# Patient Record
Sex: Female | Born: 1955 | Race: Black or African American | Hispanic: No | Marital: Married | Smoking: Former smoker
Health system: Southern US, Community
[De-identification: ages and names within clinical notes are randomized; demographics above are authoritative.]

## PROBLEM LIST (undated history)

## (undated) DIAGNOSIS — D649 Anemia, unspecified: Secondary | ICD-10-CM

## (undated) DIAGNOSIS — G894 Chronic pain syndrome: Secondary | ICD-10-CM

## (undated) DIAGNOSIS — N189 Chronic kidney disease, unspecified: Secondary | ICD-10-CM

## (undated) DIAGNOSIS — E079 Disorder of thyroid, unspecified: Secondary | ICD-10-CM

## (undated) DIAGNOSIS — F32A Depression, unspecified: Secondary | ICD-10-CM

## (undated) DIAGNOSIS — F1320 Sedative, hypnotic or anxiolytic dependence, uncomplicated: Secondary | ICD-10-CM

## (undated) DIAGNOSIS — J302 Other seasonal allergic rhinitis: Secondary | ICD-10-CM

## (undated) DIAGNOSIS — I700 Atherosclerosis of aorta: Secondary | ICD-10-CM

## (undated) DIAGNOSIS — R911 Solitary pulmonary nodule: Secondary | ICD-10-CM

## (undated) DIAGNOSIS — I10 Essential (primary) hypertension: Secondary | ICD-10-CM

## (undated) DIAGNOSIS — G4700 Insomnia, unspecified: Secondary | ICD-10-CM

## (undated) DIAGNOSIS — F419 Anxiety disorder, unspecified: Secondary | ICD-10-CM

## (undated) DIAGNOSIS — F329 Major depressive disorder, single episode, unspecified: Secondary | ICD-10-CM

## (undated) DIAGNOSIS — K219 Gastro-esophageal reflux disease without esophagitis: Secondary | ICD-10-CM

## (undated) DIAGNOSIS — J329 Chronic sinusitis, unspecified: Secondary | ICD-10-CM

## (undated) DIAGNOSIS — K589 Irritable bowel syndrome without diarrhea: Secondary | ICD-10-CM

## (undated) DIAGNOSIS — J45909 Unspecified asthma, uncomplicated: Secondary | ICD-10-CM

## (undated) HISTORY — DX: Anxiety disorder, unspecified: F41.9

## (undated) HISTORY — PX: RESECTION OF A THYMOMA: SHX6214

## (undated) HISTORY — PX: THORACIC SPINE SURGERY: SHX802

## (undated) HISTORY — DX: Essential (primary) hypertension: I10

## (undated) HISTORY — DX: Sedative, hypnotic or anxiolytic dependence, uncomplicated: F13.20

## (undated) HISTORY — PX: SINUS EXPLORATION: SHX5214

## (undated) HISTORY — DX: Irritable bowel syndrome, unspecified: K58.9

## (undated) HISTORY — DX: Major depressive disorder, single episode, unspecified: F32.9

## (undated) HISTORY — DX: Chronic kidney disease, unspecified: N18.9

## (undated) HISTORY — DX: Other seasonal allergic rhinitis: J30.2

## (undated) HISTORY — DX: Atherosclerosis of aorta: I70.0

## (undated) HISTORY — DX: Unspecified asthma, uncomplicated: J45.909

## (undated) HISTORY — PX: HEMATOMA EVACUATION: SHX5118

## (undated) HISTORY — PX: ABDOMINAL HYSTERECTOMY: SHX81

## (undated) HISTORY — DX: Gastro-esophageal reflux disease without esophagitis: K21.9

## (undated) HISTORY — DX: Disorder of thyroid, unspecified: E07.9

## (undated) HISTORY — DX: Depression, unspecified: F32.A

## (undated) HISTORY — DX: Solitary pulmonary nodule: R91.1

## (undated) HISTORY — PX: CARPAL TUNNEL RELEASE: SHX101

## (undated) HISTORY — PX: CERVICAL SPINE SURGERY: SHX589

## (undated) HISTORY — DX: Chronic pain syndrome: G89.4

## (undated) HISTORY — DX: Insomnia, unspecified: G47.00

## (undated) HISTORY — DX: Chronic sinusitis, unspecified: J32.9

## (undated) HISTORY — DX: Anemia, unspecified: D64.9

## (undated) HISTORY — PX: DILATION AND CURETTAGE OF UTERUS: SHX78

## (undated) HISTORY — PX: STOMACH SURGERY: SHX791

---

## 1997-10-28 ENCOUNTER — Other Ambulatory Visit: Admission: RE | Admit: 1997-10-28 | Discharge: 1997-10-28 | Payer: Self-pay | Admitting: *Deleted

## 1998-02-09 ENCOUNTER — Encounter: Payer: Self-pay | Admitting: Gastroenterology

## 1998-02-09 ENCOUNTER — Observation Stay (HOSPITAL_COMMUNITY): Admission: EM | Admit: 1998-02-09 | Discharge: 1998-02-10 | Payer: Self-pay

## 1999-05-15 ENCOUNTER — Other Ambulatory Visit: Admission: RE | Admit: 1999-05-15 | Discharge: 1999-05-15 | Payer: Self-pay | Admitting: *Deleted

## 2000-03-01 ENCOUNTER — Emergency Department (HOSPITAL_COMMUNITY): Admission: EM | Admit: 2000-03-01 | Discharge: 2000-03-01 | Payer: Self-pay | Admitting: Emergency Medicine

## 2000-06-16 ENCOUNTER — Other Ambulatory Visit: Admission: RE | Admit: 2000-06-16 | Discharge: 2000-06-16 | Payer: Self-pay | Admitting: *Deleted

## 2000-06-21 ENCOUNTER — Encounter: Payer: Self-pay | Admitting: Emergency Medicine

## 2000-06-21 ENCOUNTER — Emergency Department (HOSPITAL_COMMUNITY): Admission: EM | Admit: 2000-06-21 | Discharge: 2000-06-21 | Payer: Self-pay | Admitting: Emergency Medicine

## 2000-06-22 ENCOUNTER — Encounter: Payer: Self-pay | Admitting: Emergency Medicine

## 2001-05-25 ENCOUNTER — Ambulatory Visit (HOSPITAL_COMMUNITY): Admission: RE | Admit: 2001-05-25 | Discharge: 2001-05-25 | Payer: Self-pay | Admitting: Gastroenterology

## 2001-05-25 ENCOUNTER — Encounter: Payer: Self-pay | Admitting: Gastroenterology

## 2002-04-20 ENCOUNTER — Ambulatory Visit (HOSPITAL_COMMUNITY): Admission: RE | Admit: 2002-04-20 | Discharge: 2002-04-20 | Payer: Self-pay | Admitting: Gastroenterology

## 2002-04-20 ENCOUNTER — Encounter: Payer: Self-pay | Admitting: Gastroenterology

## 2003-04-08 ENCOUNTER — Emergency Department (HOSPITAL_COMMUNITY): Admission: EM | Admit: 2003-04-08 | Discharge: 2003-04-08 | Payer: Self-pay | Admitting: Emergency Medicine

## 2003-04-18 ENCOUNTER — Emergency Department (HOSPITAL_COMMUNITY): Admission: EM | Admit: 2003-04-18 | Discharge: 2003-04-19 | Payer: Self-pay | Admitting: Emergency Medicine

## 2003-05-18 ENCOUNTER — Encounter: Admission: RE | Admit: 2003-05-18 | Discharge: 2003-08-16 | Payer: Self-pay | Admitting: Neurology

## 2003-09-02 ENCOUNTER — Emergency Department (HOSPITAL_COMMUNITY): Admission: EM | Admit: 2003-09-02 | Discharge: 2003-09-03 | Payer: Self-pay | Admitting: Emergency Medicine

## 2003-10-11 ENCOUNTER — Encounter
Admission: RE | Admit: 2003-10-11 | Discharge: 2004-01-09 | Payer: Self-pay | Admitting: Physical Medicine and Rehabilitation

## 2004-05-16 ENCOUNTER — Ambulatory Visit: Payer: Self-pay | Admitting: Gastroenterology

## 2004-08-29 ENCOUNTER — Ambulatory Visit: Payer: Self-pay | Admitting: Internal Medicine

## 2005-05-28 ENCOUNTER — Encounter: Admission: RE | Admit: 2005-05-28 | Discharge: 2005-05-28 | Payer: Self-pay | Admitting: Neurology

## 2005-10-28 ENCOUNTER — Emergency Department (HOSPITAL_COMMUNITY): Admission: EM | Admit: 2005-10-28 | Discharge: 2005-10-28 | Payer: Self-pay | Admitting: Emergency Medicine

## 2005-11-27 ENCOUNTER — Encounter: Admission: RE | Admit: 2005-11-27 | Discharge: 2005-12-25 | Payer: Self-pay | Admitting: Family Medicine

## 2006-08-21 ENCOUNTER — Ambulatory Visit (HOSPITAL_COMMUNITY): Admission: RE | Admit: 2006-08-21 | Discharge: 2006-08-21 | Payer: Self-pay | Admitting: Family Medicine

## 2007-03-05 ENCOUNTER — Ambulatory Visit (HOSPITAL_COMMUNITY): Admission: RE | Admit: 2007-03-05 | Discharge: 2007-03-05 | Payer: Self-pay | Admitting: Neurosurgery

## 2008-08-31 ENCOUNTER — Encounter: Admission: RE | Admit: 2008-08-31 | Discharge: 2008-08-31 | Payer: Self-pay | Admitting: Neurosurgery

## 2008-12-28 ENCOUNTER — Emergency Department (HOSPITAL_COMMUNITY): Admission: EM | Admit: 2008-12-28 | Discharge: 2008-12-28 | Payer: Self-pay | Admitting: Emergency Medicine

## 2009-02-22 ENCOUNTER — Inpatient Hospital Stay (HOSPITAL_COMMUNITY): Admission: RE | Admit: 2009-02-22 | Discharge: 2009-02-27 | Payer: Self-pay | Admitting: Neurosurgery

## 2010-07-18 LAB — BLOOD GAS, ARTERIAL
Bicarbonate: 25.3 mEq/L — ABNORMAL HIGH (ref 20.0–24.0)
PEEP: 5 cmH2O
Patient temperature: 98.6
TCO2: 26.4 mmol/L (ref 0–100)

## 2010-07-18 LAB — BASIC METABOLIC PANEL
BUN: 6 mg/dL (ref 6–23)
CO2: 29 mEq/L (ref 19–32)
Chloride: 103 mEq/L (ref 96–112)
Creatinine, Ser: 0.92 mg/dL (ref 0.4–1.2)
GFR calc Af Amer: >60 mL/min (ref >60)
Sodium: 137 mEq/L (ref 135–145)

## 2010-07-18 LAB — CBC
HCT: 39.8 % (ref 36.0–46.0)
MCHC: 34.4 g/dL (ref 30.0–36.0)
Platelets: 248 10*3/uL (ref 150–400)
RBC: 4.03 MIL/uL (ref 3.87–5.11)
RDW: 14.1 % (ref 11.5–15.5)

## 2010-08-31 NOTE — Group Therapy Note (Signed)
REASON FOR ADMISSION:  Ms. Coccia is a 55 year old black married female who  is being seen for the first time in our [facility] for  complaints of neck, shoulder, and low back pain.   HISTORY OF PRESENT ILLNESS:  She reports her number one pain is posterior  neck pain.  Her number two on her list is her right lower thoracic pain.  The number three is her problems with her bowel which we will not be  addressing in the clinic today.  Number four is her shoulder pain.   Her neck pain averages about a 6 or a 7 on a scale of 10.  She has been seen  in previous pain management settings.  Has been treated in the past with  Demerol, Phenergan, Vicodin, Percocet, morphine, soft collar, Soma,  Ultracet.  Has had physical therapy.  She had a MRI March of 2005, a  cervical MRI, which showed shallow protrusion at C3/4 and C4/5.  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  It was noted that there may be some slight compromise exiting bilateral  neural foramina at the C4/5 level.   On April 08, 2003, she had plain films which showed degenerative disc  disease at the C5 level as well as some mild cervical foraminal compromise.   She underwent a procedure by Dr. Hilda Lias in 1991 at the T10 and T11  thoracic level and has had chronic pain into the right lower thoracic level  since that time.  She denies any new problems with numbness, tingling, and  no new bowel or bladder problems.  She has been taking Vicodin for pain  management.  She takes this on a p.r.n. basis.  For pain, she also takes the  [place] and has been on Darvocet in the past.   She is independent with all of her activities of daily living and mobility.  She walks about 10 minutes a day.  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  She is on the phone somewhat helping people find jobs.  She has a  degree in social work.  She spends some time working on the computer  doing  background investigations.  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  She denies any suicidal ideations.  She feels she is coping  well with her pain.  Denies alcohol, tobacco, or illicit drug use.  She is  married to a retired Nurse, adult x 20 years.  She last worked in 1999.  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  She received disability in 1999.   ALLERGIES/INTOLERANCES:  She reports that she gets gastritis and diarrhea  with  ANTI-INFLAMMATORY medications.  She has also had some intolerance or  allergy to PREDNISONE, DILAUDID, and LEVAQUIN, as well as some HORMONE.  She  is unable to elaborate on that.   REVIEW OF SYMPTOMS:  She did not fill out her review of systems.   PAST MEDICAL HISTORY:  1. Remarkable for essential tremor.  2. Chronic rhinitis.  3. History of gastritis.  4. Fibromyalgia.  5. Irritable bowel syndrome.  6. Chronic headache.  7. 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   PAST SURGICAL HISTORY:  1. Remarkable for T10/11 surgery by Dr. Hilda Lias in  1991.  2. Sinus surgery in 2000.  3. 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  4. Carpal tunnel surgery.  5. Two D&Cs.  6. Total hysterectomy.  7. Stomach surgery x 2 in 1989 and 1990.   PHYSICAL EXAMINATION:  GENERAL:  Reveals a thin, black female in no apparent  distress.  Appropriate and cooperative.  SKIN:  Remarkable for prominent sternal scar as well as thoracic scar in the  lower thoracic just to the right of the spine.  MUSCULOSKELETAL:  She has some limitations in cervical range of motion.  HEART:  Regular rhythm.  LUNGS:  Clear.  ABDOMEN:  Bowel sounds positive.  Benign.  EXTREMITIES:  Positive for bilateral upper extremity tremor.  No edema is  noted.  No instability is noted.  Gait is normal.  Romberg test is  negative.  She has full shoulder range of motion.  NEUROLOGICAL:  Reflexes are 2+ at biceps, triceps, brachial radialis  bilaterally.  They are 2+ at the knees and ankles bilaterally.  Toes are  downgoing.  No clonus is noted.  Motor strength in upper and lower  extremities is 5/5 at shoulder abductors, biceps, triceps, wrist extensor,  finger flexors, and intrinsics.  They are 5/5 at hip flexors, knee  extensors, dorsiflexors, and plantar flexors.   IMPRESSION:  1. Chronic neck pain.  2. Degenerative disc disease of the cervical spine.  3. Chronic headache.   DISCHARGE MEDICATIONS:  1. We will start the patient on Nortriptyline 10 mg 1 p.o. q.h.s.  2. We will give her Vicodin 5/500 mg 2 p.o. q.p.m. and 2 p.o. q.h.s. with     120 units given.  3. Also, we will give her Lidoderm 1 to 3 patches apply directly 12 hours on     and 12 hours off.   FOLLOW UP:  We will see her back in one month.     Brantley Stage, M.D.   DMK/MedQ  D:  10/12/2003 17:39:30  T:  10/13/2003 00:27:44  Job #:  [id number]   cc:   Otilio Connors. Gerri Spore, M.D.  [address]  [place]  [place] [address]  Fax: [phone]

## 2011-03-13 ENCOUNTER — Other Ambulatory Visit (HOSPITAL_COMMUNITY): Payer: Self-pay | Admitting: Anesthesiology

## 2011-03-13 ENCOUNTER — Ambulatory Visit (HOSPITAL_COMMUNITY)
Admission: RE | Admit: 2011-03-13 | Discharge: 2011-03-13 | Disposition: A | Discharge status: Home / Self Care | Payer: Medicare Other | Source: Ambulatory Visit | Attending: Anesthesiology | Admitting: Anesthesiology

## 2011-03-13 DIAGNOSIS — Z981 Arthrodesis status: Secondary | ICD-10-CM | POA: Insufficient documentation

## 2011-03-13 DIAGNOSIS — M542 Cervicalgia: Secondary | ICD-10-CM

## 2011-03-13 DIAGNOSIS — M5030 Other cervical disc degeneration, unspecified cervical region: Secondary | ICD-10-CM | POA: Insufficient documentation

## 2013-07-06 ENCOUNTER — Other Ambulatory Visit (HOSPITAL_COMMUNITY): Payer: Self-pay | Admitting: *Deleted

## 2013-07-06 ENCOUNTER — Ambulatory Visit (HOSPITAL_COMMUNITY)
Admission: RE | Admit: 2013-07-06 | Discharge: 2013-07-06 | Disposition: A | Discharge status: Home / Self Care | Payer: Medicare PPO | Source: Ambulatory Visit | Attending: Anesthesiology | Admitting: Anesthesiology

## 2013-07-06 DIAGNOSIS — M4120 Other idiopathic scoliosis, site unspecified: Secondary | ICD-10-CM | POA: Insufficient documentation

## 2013-07-06 DIAGNOSIS — R52 Pain, unspecified: Secondary | ICD-10-CM

## 2013-07-06 DIAGNOSIS — M545 Low back pain, unspecified: Secondary | ICD-10-CM | POA: Insufficient documentation

## 2013-07-06 DIAGNOSIS — G8929 Other chronic pain: Secondary | ICD-10-CM | POA: Insufficient documentation

## 2014-06-13 ENCOUNTER — Other Ambulatory Visit: Payer: Self-pay | Admitting: Family Medicine

## 2014-06-13 DIAGNOSIS — R7989 Other specified abnormal findings of blood chemistry: Secondary | ICD-10-CM

## 2014-06-24 ENCOUNTER — Other Ambulatory Visit: Payer: BC Managed Care – PPO

## 2014-07-01 ENCOUNTER — Other Ambulatory Visit: Payer: BC Managed Care – PPO

## 2014-07-05 ENCOUNTER — Other Ambulatory Visit: Payer: BC Managed Care – PPO

## 2014-07-08 ENCOUNTER — Other Ambulatory Visit: Payer: BC Managed Care – PPO

## 2014-07-14 ENCOUNTER — Other Ambulatory Visit: Payer: BC Managed Care – PPO

## 2014-07-19 ENCOUNTER — Other Ambulatory Visit: Payer: BC Managed Care – PPO

## 2014-07-21 ENCOUNTER — Other Ambulatory Visit: Payer: BC Managed Care – PPO

## 2014-07-25 ENCOUNTER — Other Ambulatory Visit: Payer: BC Managed Care – PPO

## 2014-11-25 DIAGNOSIS — M961 Postlaminectomy syndrome, not elsewhere classified: Secondary | ICD-10-CM | POA: Diagnosis not present

## 2014-11-25 DIAGNOSIS — Z79891 Long term (current) use of opiate analgesic: Secondary | ICD-10-CM | POA: Diagnosis not present

## 2014-11-25 DIAGNOSIS — M47812 Spondylosis without myelopathy or radiculopathy, cervical region: Secondary | ICD-10-CM | POA: Diagnosis not present

## 2014-11-25 DIAGNOSIS — G894 Chronic pain syndrome: Secondary | ICD-10-CM | POA: Diagnosis not present

## 2014-12-20 DIAGNOSIS — J329 Chronic sinusitis, unspecified: Secondary | ICD-10-CM | POA: Diagnosis not present

## 2015-01-19 DIAGNOSIS — Z79891 Long term (current) use of opiate analgesic: Secondary | ICD-10-CM | POA: Diagnosis not present

## 2015-01-19 DIAGNOSIS — M961 Postlaminectomy syndrome, not elsewhere classified: Secondary | ICD-10-CM | POA: Diagnosis not present

## 2015-01-19 DIAGNOSIS — M47812 Spondylosis without myelopathy or radiculopathy, cervical region: Secondary | ICD-10-CM | POA: Diagnosis not present

## 2015-01-19 DIAGNOSIS — G894 Chronic pain syndrome: Secondary | ICD-10-CM | POA: Diagnosis not present

## 2015-02-20 DIAGNOSIS — J310 Chronic rhinitis: Secondary | ICD-10-CM | POA: Diagnosis not present

## 2015-02-20 DIAGNOSIS — Z23 Encounter for immunization: Secondary | ICD-10-CM | POA: Diagnosis not present

## 2015-03-08 DIAGNOSIS — M47812 Spondylosis without myelopathy or radiculopathy, cervical region: Secondary | ICD-10-CM | POA: Diagnosis not present

## 2015-03-08 DIAGNOSIS — G894 Chronic pain syndrome: Secondary | ICD-10-CM | POA: Diagnosis not present

## 2015-03-08 DIAGNOSIS — Z79891 Long term (current) use of opiate analgesic: Secondary | ICD-10-CM | POA: Diagnosis not present

## 2015-03-08 DIAGNOSIS — M961 Postlaminectomy syndrome, not elsewhere classified: Secondary | ICD-10-CM | POA: Diagnosis not present

## 2015-03-17 ENCOUNTER — Ambulatory Visit: Payer: BC Managed Care – PPO | Admitting: Neurology

## 2015-03-24 ENCOUNTER — Ambulatory Visit: Payer: BC Managed Care – PPO | Admitting: Neurology

## 2015-04-24 ENCOUNTER — Ambulatory Visit: Payer: BC Managed Care – PPO | Admitting: Neurology

## 2015-04-27 ENCOUNTER — Ambulatory Visit (INDEPENDENT_AMBULATORY_CARE_PROVIDER_SITE_OTHER): Payer: Medicare Other | Admitting: Neurology

## 2015-04-27 ENCOUNTER — Encounter: Payer: Self-pay | Admitting: Neurology

## 2015-04-27 ENCOUNTER — Other Ambulatory Visit: Payer: Medicare Other

## 2015-04-27 VITALS — BP 102/62 | HR 77 | Ht 64.0 in | Wt 128.0 lb

## 2015-04-27 DIAGNOSIS — W19XXXA Unspecified fall, initial encounter: Secondary | ICD-10-CM | POA: Diagnosis not present

## 2015-04-27 DIAGNOSIS — R55 Syncope and collapse: Secondary | ICD-10-CM

## 2015-04-27 DIAGNOSIS — R443 Hallucinations, unspecified: Secondary | ICD-10-CM | POA: Diagnosis not present

## 2015-04-27 DIAGNOSIS — Z87898 Personal history of other specified conditions: Secondary | ICD-10-CM | POA: Diagnosis not present

## 2015-04-27 DIAGNOSIS — Z86018 Personal history of other benign neoplasm: Secondary | ICD-10-CM

## 2015-04-27 NOTE — Patient Instructions (Signed)
1. Your provider has requested that you have labwork completed today. Please go to [facility] ([id number]) on the second floor of this building before leaving the office today. You do not need to check in. If you are not called within 15 minutes please check with the front desk.  2. We have scheduled you at [facility] for your MRI on 05/12/2015 at 12:00 pm. Please arrive 15 minutes prior and go to 1st floor radiology. If you need to reschedule for any reason please call [phone].

## 2015-04-27 NOTE — Progress Notes (Signed)
Margaret Dominguez was seen today in neurologic consultation at the request of WEBB, CAROL D, MD.  The patient presents today for concerns with falls, near syncope and hallucinations.   The patient states that she has been falling for 8 months-1 year.  The patient has trouble staying focused and cannot describe her falls well so her husband was retrieved from the waiting room to see if better hx could be obtained.  Her husband states that he has never witnessed a fall and there is confusion whether she actually falls or whether she actually is fainting.  He does state that she has fallen out of bed several times.  However, when they say "falls" otherwise, he admits it is nothing he has witnessed, but rather he will come home and she is asleep on the floor in the closet (not sure if she fell asleep on the floor or passed out).  Pt states that she does pass out as she cannot remember the passage of time and recently hit her eye going down (does have a bruise below eye).   Her husband states that sometimes he will come home and she will think that someone is there and she will be getting towels and washcloths for "people" who are in the house (no one there).  Sometimes, her husband will tell her that he is leaving to go to work and she will call him an hour later to ask where he is as if he never told her.  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  When I asked her if she had hx of MG, she said yes but had trouble with stating if there was resolution of the myasthenia after the thymoma removal.  She goes off on a tangent and talks to me about a TB test when I tried to ask her about this.  When asked about diplopia, she says that she gets vertigo sometimes.   Neuroimaging has  previously been performed. She had a CT of the brain in September, 2010 that was normal.  She had an MRI of  the brain on 05/30/2005 that appears to be normal.  No formal report is available for that onebut I did review this.  The patient did not know her medications and the pharmacy had to be contacted.  She also had a significant amount of difficulty providing her past history, primarily due to focus issue.     ALLERGIES:   Allergies  Allergen Reactions  . Dilaudid [Hydromorphone]   . Levaquin [Levofloxacin In D5w]   . Prednisone     CURRENT MEDICATIONS:  Outpatient Encounter Prescriptions as of 04/27/2015  Medication Sig  . albuterol (PROVENTIL) (2.5 MG/3ML) 0.083% nebulizer solution Take 2.5 mg by nebulization every 6 (six) hours as needed for wheezing or shortness of breath.  . Biotin 1 MG CAPS Take by mouth daily.  Marland Kitchen buPROPion (WELLBUTRIN SR) 100 MG 12 hr tablet Take 100 mg by mouth 2 (two) times daily.  . calcium carbonate (CALCIUM 600) 600 MG TABS tablet Take 600 mg by mouth 2 (two) times daily with a meal.  . calcium carbonate (TUMS - DOSED IN MG ELEMENTAL CALCIUM) 500 MG chewable tablet Chew 1 tablet by mouth daily.  . clonazePAM (KLONOPIN) 2 MG tablet Take 2 mg by mouth 4 (four) times daily.   . cromolyn (OPTICROM)  4 % ophthalmic solution 4 (four) times daily.  . diclofenac sodium (VOLTAREN) 1 % GEL Apply topically 4 (four) times daily.  . fexofenadine (ALLEGRA) 180 MG tablet Take 180 mg by mouth daily.  . fluticasone (FLONASE) 50 MCG/ACT nasal spray Place into both nostrils daily.  . mometasone (ASMANEX) 220 MCG/INH inhaler Inhale 2 puffs into the lungs daily.  . montelukast (SINGULAIR) 10 MG tablet Take 10 mg by mouth at bedtime.  . tapentadol (NUCYNTA) 50 MG TABS tablet Take 50 mg by mouth 2 (two) times daily.  Marland Kitchen. tiZANidine (ZANAFLEX) 4 MG capsule Take 4 mg by mouth 3 (three) times daily as needed for muscle spasms.  Marland Kitchen. venlafaxine XR (EFFEXOR-XR) 75 MG 24 hr capsule Take 75 mg by mouth daily with breakfast.  . verapamil (VERELAN PM) 240 MG 24 hr capsule Take 240 mg by mouth at  bedtime.  . vitamin E 400 UNIT capsule Take 400 Units by mouth daily.  . [DISCONTINUED] Tapentadol HCl (NUCYNTA) 75 MG TABS Take by mouth.  . [DISCONTINUED] venlafaxine (EFFEXOR) 100 MG tablet Take 100 mg by mouth 2 (two) times daily.   No facility-administered encounter medications on file as of 04/27/2015.    PAST MEDICAL HISTORY:   Past Medical History  Diagnosis Date  . Depression   . Chronic pain syndrome   . Asthma   . Seasonal allergies     PAST SURGICAL HISTORY:   Past Surgical History  Procedure Laterality Date  . Resection of a thymoma    . Carpal tunnel release Bilateral   . Sinus exploration    . Dilation and curettage of uterus    . Abdominal hysterectomy    . Thoracic spine surgery    . Stomach surgery  x2    cut vagus nerve - repair  . Cervical spine surgery    . Hematoma evacuation      SOCIAL HISTORY:   Social History   Social History  . Marital Status: Married    Spouse Name: N/A  . Number of Children: N/A  . Years of Education: N/A   Occupational History  . Not on file.   Social History Main Topics  . Smoking status: Former Games developermoker  . Smokeless tobacco: Not on file  . Alcohol Use: No  . Drug Use: No  . Sexual Activity: Not on file   Other Topics Concern  . Not on file   Social History Narrative  . No narrative on file    FAMILY HISTORY:   No family status information on file.    ROS:  A complete 10 system review of systems was obtained and was unremarkable apart from what is mentioned above.  PHYSICAL EXAMINATION:    VITALS:   Filed Vitals:   04/27/15 1458  BP: 102/62  Pulse: 77  Height: 5\' 4"  (1.626 m)  Weight: 128 lb (58.06 kg)  SpO2: 99%    GEN:  Normal appears female in no acute distress.  Appears stated age. HEENT:  Normocephalic.  Healing ecchymosis under L eye.  The mucous membranes are moist. The superficial temporal arteries are without ropiness or tenderness. Cardiovascular: Regular rate and rhythm. Lungs: Clear  to auscultation bilaterally. Neck/Heme: There are no carotid bruits noted bilaterally.  NEUROLOGICAL: Orientation:  The patient is alert and oriented x 3.  However, she is very tangential in speech and has trouble staying focused, giving hx, etc. Cranial nerves: There is good facial symmetry. The pupils are equal round and reactive to light bilaterally. Fundoscopic  exam reveals clear disc margins bilaterally. Extraocular muscles are intact and visual fields are full to confrontational testing. Speech is fluent and clear. Soft palate rises symmetrically and there is no tongue deviation. Hearing is intact to conversational tone. Tone: Tone is good throughout. Sensation: Sensation is intact to light touch and pinprick throughout (facial, trunk, extremities). Vibration is intact at the bilateral big toe. There is no extinction with double simultaneous stimulation. There is no sensory dermatomal level identified. Coordination:  The patient has no difficulty with RAM's or FNF bilaterally. Motor: Strength is 5/5 in the bilateral upper and lower extremities.  Shoulder shrug is equal and symmetric. There is no pronator drift.  There are no fasciculations noted. DTR's: Deep tendon reflexes are 1/4 at the bilateral biceps, triceps, brachioradialis, patella and achilles.  Plantar responses are downgoing bilaterally. Gait and Station: The patient is able to ambulate without difficulty.  She does have some difficulty ambulating in a tandem fashion.  She was able to stand in the Romberg position with eyes open and closed.  Her history is incredibly poor   IMPRESSION/PLAN  1. Episodes of falls, near syncope and hallucinations.  -Unfortunately, her history is incredibly poor.  Her husband has never witnessed a fall and the patient has  These ar trouble describing any type of fall to me.  Her husband does state that he will come home from work and find her on the floor but he is not sure if she has fallen asleep on  the floor or passed out.   I suspect that all of these events are most likely due to medications.  She is on a significant amount clonazepam daily (2mg  qid - may have recently been decreased to tid) along with Nucynta.  These are prescribed by different providers.  She is also on zanaflex.   I talked to her today about the fact that both of these medications can cause cognitive dulling and mental status change.  I will do an MRI of the brain and EEG, but I think that the yield of finding anything structural is low.  Her neurologic examination is nonfocal and nonlateralizing.  I will also check MG panel given hx of thymoma years ago.  I don't think that she has any sx's of such now though but she is such a poor historian I will check.  I would not want her driving given her sx's, even if due to medications.

## 2015-05-04 ENCOUNTER — Other Ambulatory Visit: Payer: Medicare Other

## 2015-05-04 LAB — MYASTHENIA GRAVIS PANEL 2
ACETYLCHOLINE REC MOD AB: 13 %{inhibition}
Acetylcholine Rec Binding: <0.3 nmol/L
Aceytlcholine Rec Bloc Ab: <15 % of inhibition (ref <15)

## 2015-05-05 ENCOUNTER — Telehealth: Payer: Self-pay | Admitting: Neurology

## 2015-05-05 NOTE — Telephone Encounter (Signed)
-----   Message from Margaret Batty Tat, DO sent at 05/05/2015  7:49 AM EST ----- You can let pt know that labs we drew were ok

## 2015-05-05 NOTE — Telephone Encounter (Signed)
Patient made aware blood work okay.

## 2015-05-10 ENCOUNTER — Other Ambulatory Visit: Payer: Medicare Other

## 2015-05-12 ENCOUNTER — Ambulatory Visit (HOSPITAL_COMMUNITY): Admission: RE | Admit: 2015-05-12 | Payer: Medicare Other | Source: Ambulatory Visit

## 2015-05-26 ENCOUNTER — Ambulatory Visit (HOSPITAL_COMMUNITY): Admission: RE | Admit: 2015-05-26 | Payer: Medicare Other | Source: Ambulatory Visit

## 2015-06-09 ENCOUNTER — Ambulatory Visit (HOSPITAL_COMMUNITY)
Admission: RE | Admit: 2015-06-09 | Discharge: 2015-06-09 | Disposition: A | Discharge status: Home / Self Care | Payer: Medicare Other | Source: Ambulatory Visit | Attending: Neurology | Admitting: Neurology

## 2015-06-09 DIAGNOSIS — R443 Hallucinations, unspecified: Secondary | ICD-10-CM | POA: Insufficient documentation

## 2015-06-09 DIAGNOSIS — W19XXXA Unspecified fall, initial encounter: Secondary | ICD-10-CM | POA: Diagnosis not present

## 2015-06-09 DIAGNOSIS — R55 Syncope and collapse: Secondary | ICD-10-CM

## 2015-06-12 ENCOUNTER — Telehealth: Payer: Self-pay | Admitting: Neurology

## 2015-06-12 NOTE — Telephone Encounter (Signed)
Patient made aware MR okay.  

## 2015-06-12 NOTE — Telephone Encounter (Signed)
-----   Message from Octaviano Batty Tat, DO sent at 06/11/2015  4:58 PM EST ----- Reviewed and agree.  Bren Borys please let pt know that MRI looks great

## 2017-02-05 ENCOUNTER — Other Ambulatory Visit: Payer: Self-pay | Admitting: Family Medicine

## 2017-02-05 DIAGNOSIS — R1011 Right upper quadrant pain: Secondary | ICD-10-CM

## 2017-02-11 ENCOUNTER — Other Ambulatory Visit: Payer: Medicare Other

## 2017-02-14 ENCOUNTER — Other Ambulatory Visit: Payer: Medicare Other

## 2017-02-17 ENCOUNTER — Ambulatory Visit
Admission: RE | Admit: 2017-02-17 | Discharge: 2017-02-17 | Disposition: A | Discharge status: Home / Self Care | Payer: Medicare Other | Source: Ambulatory Visit | Attending: Family Medicine | Admitting: Family Medicine

## 2017-02-17 DIAGNOSIS — R1011 Right upper quadrant pain: Secondary | ICD-10-CM

## 2017-07-11 ENCOUNTER — Other Ambulatory Visit: Payer: Self-pay | Admitting: Orthopaedic Surgery

## 2017-07-11 DIAGNOSIS — M542 Cervicalgia: Secondary | ICD-10-CM

## 2017-07-11 DIAGNOSIS — M546 Pain in thoracic spine: Secondary | ICD-10-CM

## 2017-07-19 ENCOUNTER — Inpatient Hospital Stay: Admission: RE | Admit: 2017-07-19 | Payer: Medicare Other | Source: Ambulatory Visit

## 2017-07-19 ENCOUNTER — Other Ambulatory Visit: Payer: Medicare Other

## 2017-07-26 ENCOUNTER — Other Ambulatory Visit: Payer: Medicare Other

## 2017-07-26 ENCOUNTER — Inpatient Hospital Stay
Admission: RE | Admit: 2017-07-26 | Discharge: 2017-07-26 | Disposition: A | Discharge status: Home / Self Care | Payer: Medicare Other | Source: Ambulatory Visit | Attending: Orthopaedic Surgery | Admitting: Orthopaedic Surgery

## 2017-07-27 ENCOUNTER — Other Ambulatory Visit: Payer: Medicare Other

## 2017-08-01 ENCOUNTER — Other Ambulatory Visit: Payer: Medicare Other

## 2017-08-08 ENCOUNTER — Ambulatory Visit
Admission: RE | Admit: 2017-08-08 | Discharge: 2017-08-08 | Disposition: A | Discharge status: Home / Self Care | Payer: Medicare Other | Source: Ambulatory Visit | Attending: Orthopaedic Surgery | Admitting: Orthopaedic Surgery

## 2017-08-08 DIAGNOSIS — M546 Pain in thoracic spine: Secondary | ICD-10-CM

## 2017-08-08 DIAGNOSIS — M542 Cervicalgia: Secondary | ICD-10-CM

## 2019-07-21 ENCOUNTER — Other Ambulatory Visit: Payer: Self-pay | Admitting: Family Medicine

## 2019-07-23 ENCOUNTER — Other Ambulatory Visit: Payer: Self-pay | Admitting: Family Medicine

## 2019-07-23 DIAGNOSIS — R221 Localized swelling, mass and lump, neck: Secondary | ICD-10-CM

## 2019-07-29 ENCOUNTER — Other Ambulatory Visit: Payer: Medicare Other

## 2019-08-04 ENCOUNTER — Other Ambulatory Visit: Payer: Medicare PPO

## 2019-08-13 ENCOUNTER — Ambulatory Visit
Admission: RE | Admit: 2019-08-13 | Discharge: 2019-08-13 | Disposition: A | Discharge status: Home / Self Care | Payer: Medicare PPO | Source: Ambulatory Visit | Attending: Family Medicine | Admitting: Family Medicine

## 2019-08-13 DIAGNOSIS — R221 Localized swelling, mass and lump, neck: Secondary | ICD-10-CM

## 2019-10-12 DIAGNOSIS — M961 Postlaminectomy syndrome, not elsewhere classified: Secondary | ICD-10-CM | POA: Diagnosis not present

## 2019-10-12 DIAGNOSIS — M47812 Spondylosis without myelopathy or radiculopathy, cervical region: Secondary | ICD-10-CM | POA: Diagnosis not present

## 2019-10-12 DIAGNOSIS — Z79891 Long term (current) use of opiate analgesic: Secondary | ICD-10-CM | POA: Diagnosis not present

## 2019-10-12 DIAGNOSIS — G894 Chronic pain syndrome: Secondary | ICD-10-CM | POA: Diagnosis not present

## 2019-11-05 DIAGNOSIS — J0100 Acute maxillary sinusitis, unspecified: Secondary | ICD-10-CM | POA: Diagnosis not present

## 2019-12-16 DIAGNOSIS — G894 Chronic pain syndrome: Secondary | ICD-10-CM | POA: Diagnosis not present

## 2019-12-16 DIAGNOSIS — M961 Postlaminectomy syndrome, not elsewhere classified: Secondary | ICD-10-CM | POA: Diagnosis not present

## 2019-12-16 DIAGNOSIS — M47812 Spondylosis without myelopathy or radiculopathy, cervical region: Secondary | ICD-10-CM | POA: Diagnosis not present

## 2019-12-16 DIAGNOSIS — Z79891 Long term (current) use of opiate analgesic: Secondary | ICD-10-CM | POA: Diagnosis not present

## 2019-12-24 DIAGNOSIS — J0100 Acute maxillary sinusitis, unspecified: Secondary | ICD-10-CM | POA: Diagnosis not present

## 2020-01-18 DIAGNOSIS — W19XXXS Unspecified fall, sequela: Secondary | ICD-10-CM | POA: Diagnosis not present

## 2020-01-18 DIAGNOSIS — S0083XA Contusion of other part of head, initial encounter: Secondary | ICD-10-CM | POA: Diagnosis not present

## 2020-03-01 DIAGNOSIS — Z79891 Long term (current) use of opiate analgesic: Secondary | ICD-10-CM | POA: Diagnosis not present

## 2020-03-01 DIAGNOSIS — M47812 Spondylosis without myelopathy or radiculopathy, cervical region: Secondary | ICD-10-CM | POA: Diagnosis not present

## 2020-03-01 DIAGNOSIS — G894 Chronic pain syndrome: Secondary | ICD-10-CM | POA: Diagnosis not present

## 2020-03-01 DIAGNOSIS — M961 Postlaminectomy syndrome, not elsewhere classified: Secondary | ICD-10-CM | POA: Diagnosis not present

## 2020-04-04 DIAGNOSIS — J329 Chronic sinusitis, unspecified: Secondary | ICD-10-CM | POA: Diagnosis not present

## 2020-05-12 DIAGNOSIS — M269 Dentofacial anomaly, unspecified: Secondary | ICD-10-CM | POA: Diagnosis not present

## 2020-05-12 DIAGNOSIS — M2632 Excessive spacing of fully erupted teeth: Secondary | ICD-10-CM | POA: Diagnosis not present

## 2020-05-25 DIAGNOSIS — G894 Chronic pain syndrome: Secondary | ICD-10-CM | POA: Diagnosis not present

## 2020-05-25 DIAGNOSIS — Z79891 Long term (current) use of opiate analgesic: Secondary | ICD-10-CM | POA: Diagnosis not present

## 2020-05-25 DIAGNOSIS — M961 Postlaminectomy syndrome, not elsewhere classified: Secondary | ICD-10-CM | POA: Diagnosis not present

## 2020-05-25 DIAGNOSIS — M47812 Spondylosis without myelopathy or radiculopathy, cervical region: Secondary | ICD-10-CM | POA: Diagnosis not present

## 2020-06-13 DIAGNOSIS — K089 Disorder of teeth and supporting structures, unspecified: Secondary | ICD-10-CM | POA: Diagnosis not present

## 2020-06-13 DIAGNOSIS — J329 Chronic sinusitis, unspecified: Secondary | ICD-10-CM | POA: Diagnosis not present

## 2020-08-21 IMAGING — US US THYROID
1 series · 14 of 25 positions shown · non-contrast
Comparison: None.

CLINICAL DATA: 63-year-old female with a neck mass

EXAM:
THYROID ULTRASOUND
TECHNIQUE: Ultrasound examination of the thyroid gland and adjacent soft
tissues was performed.

[Series 1: us thyroid · 0.05mm/px · 14 of 41 slices shown]
[im 1/41]
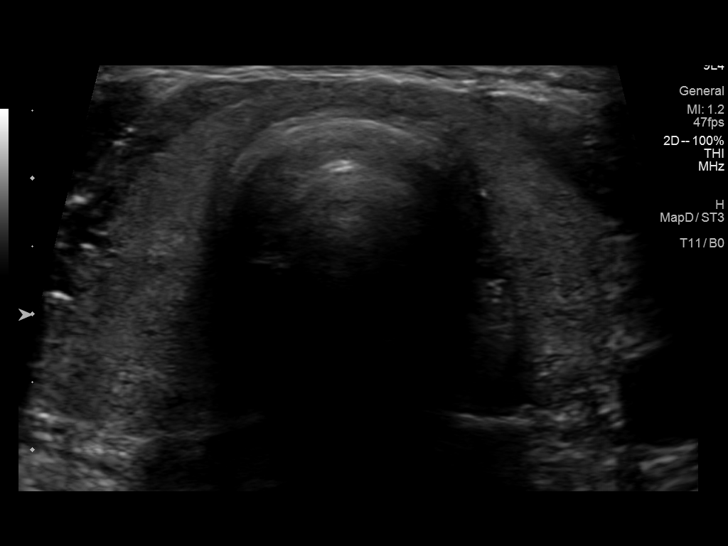
[im 4/41]
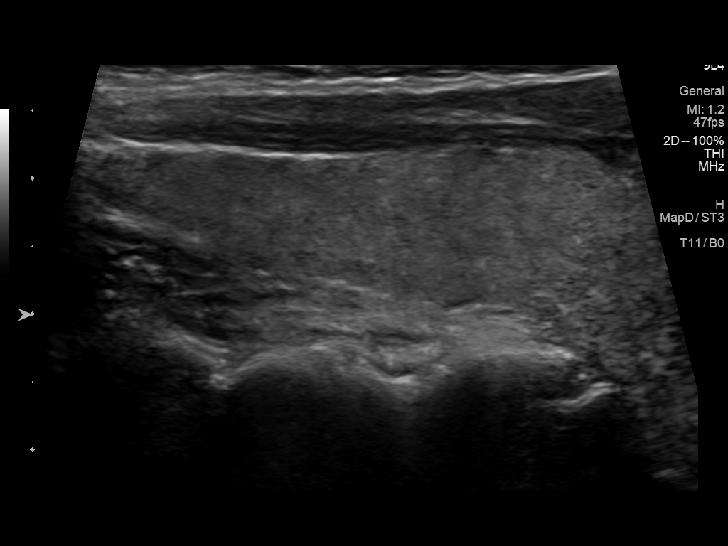
[im 7/41]
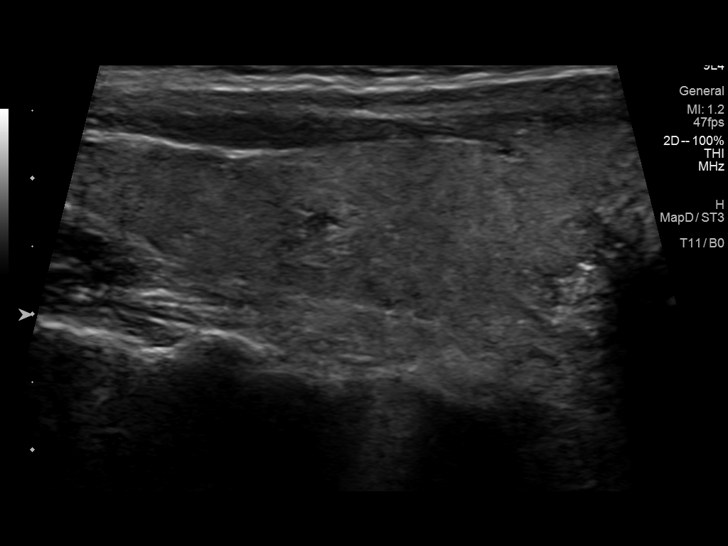
[im 11/41]
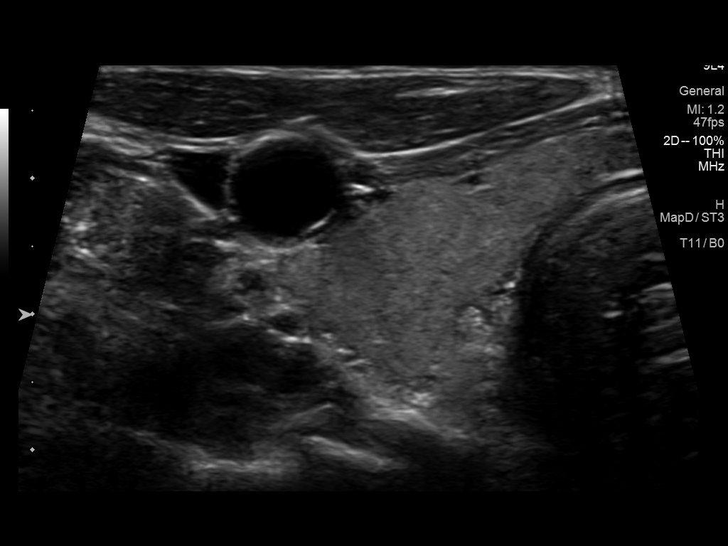
[im 14/41]
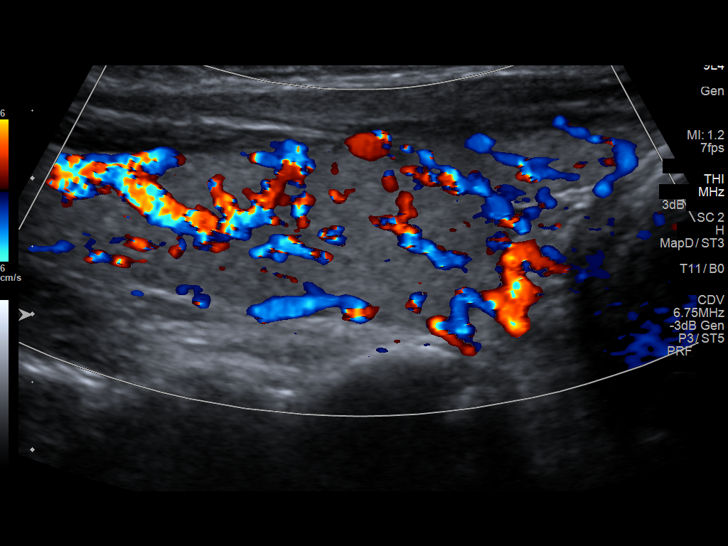
[im 16/41]
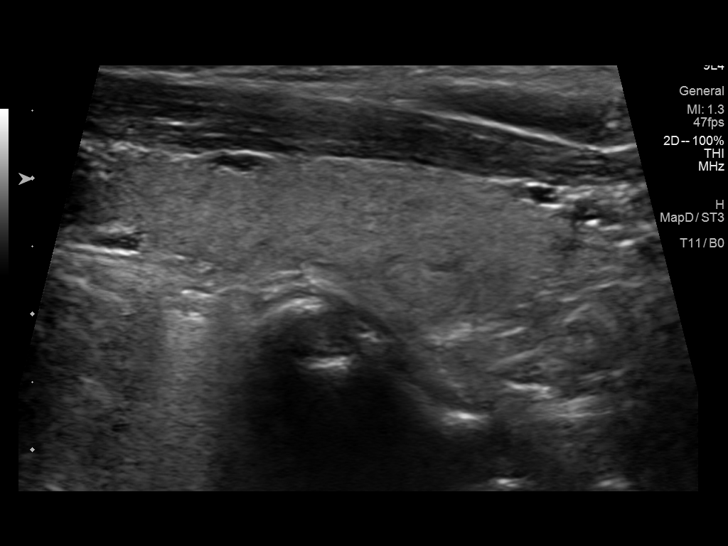
[im 19/41]
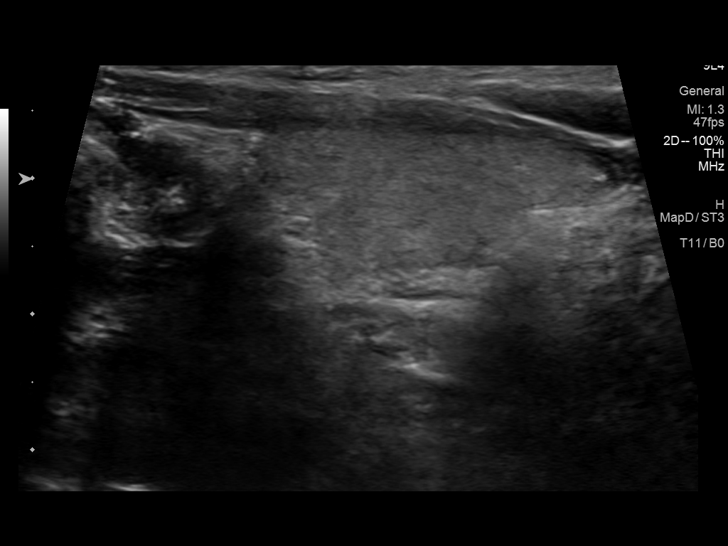
[im 22/41]
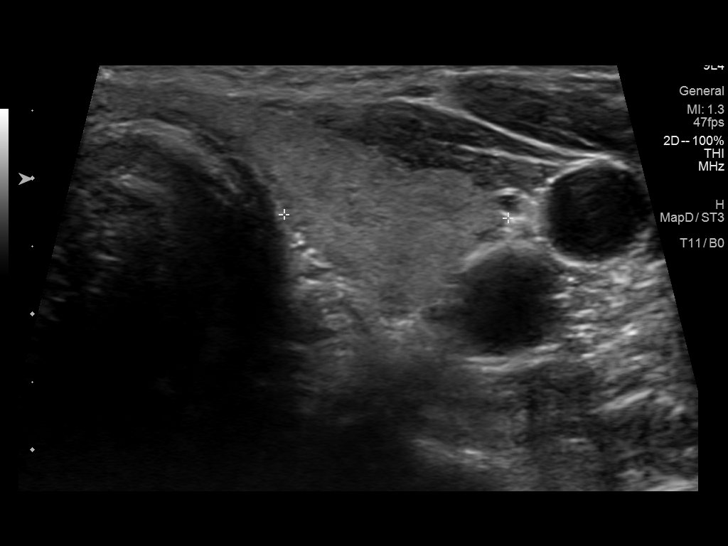
[im 26/41]
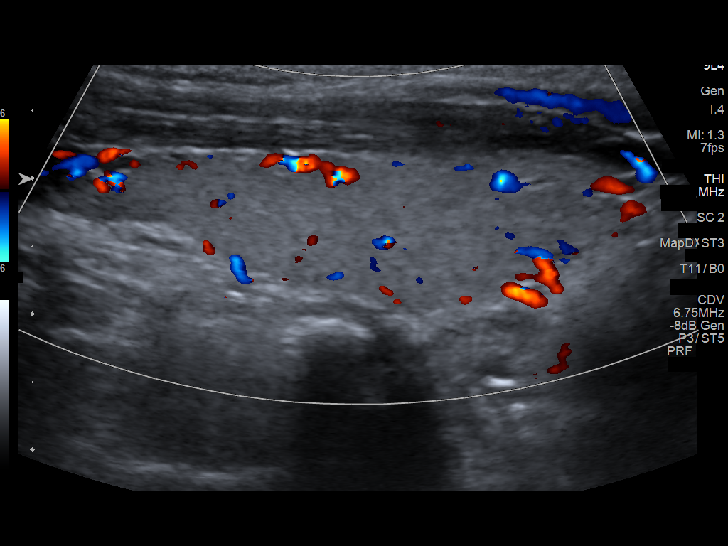
[im 27/41]
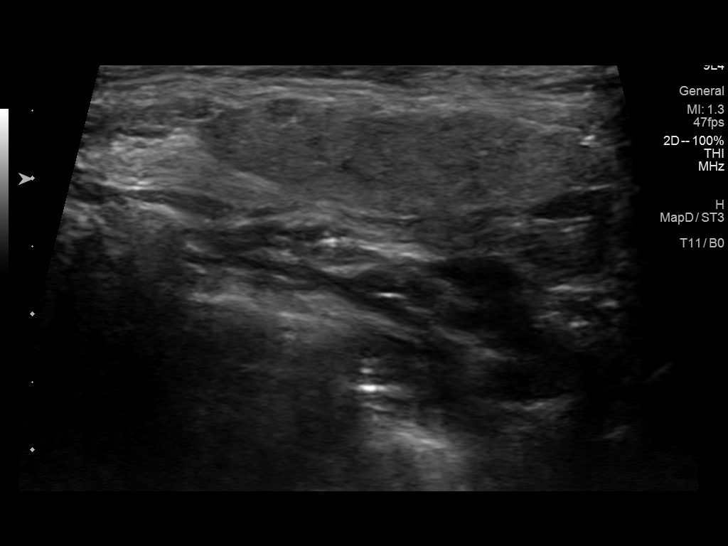
[im 31/41]
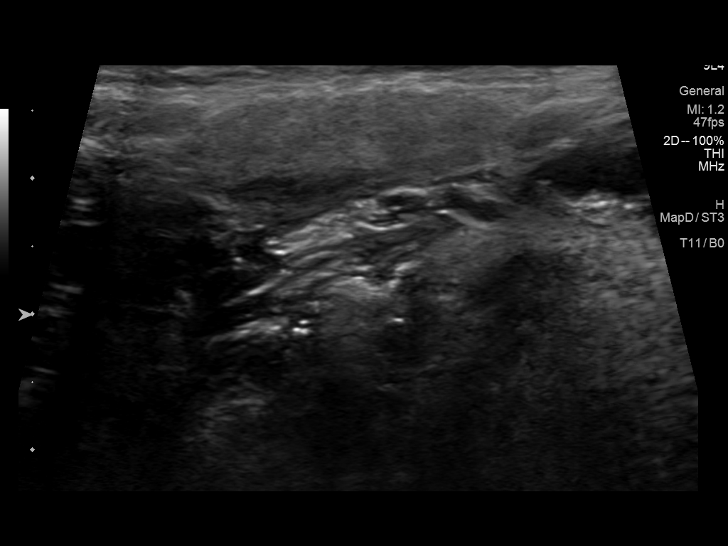
[im 34/41]
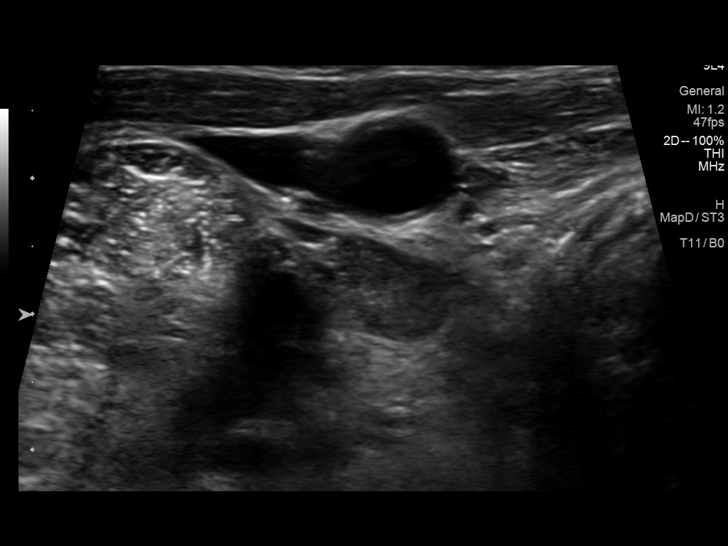
[im 37/41]
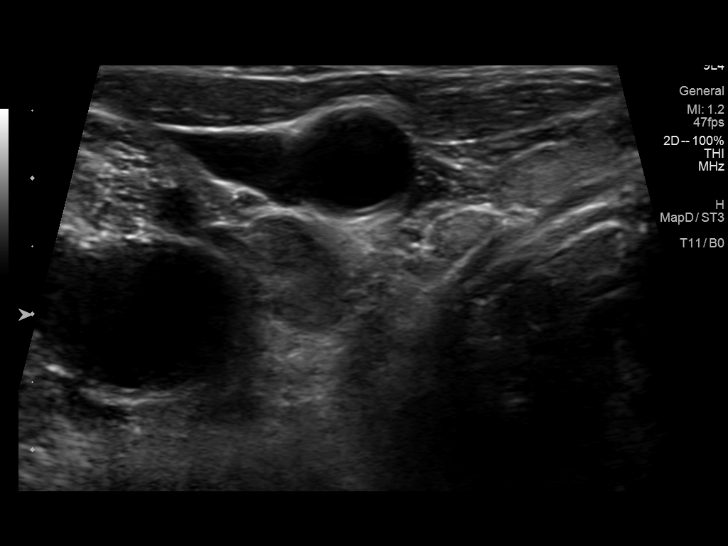
[im 41/41]
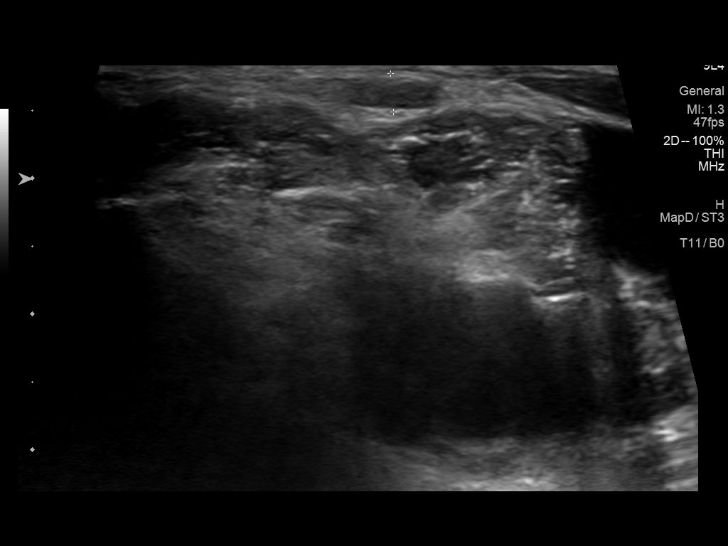

[14 of 25 positions shown; findings below may reference images not displayed]

FINDINGS: Parenchymal Echotexture: Normal

Isthmus: 0.2 cm

Right lobe: 4.6 cm x 1.3 cm x 1.8 cm

Left lobe: 4.1 cm x 1.2 cm x 1.7 cm

_________________________________________________________

Estimated total number of nodules >/= 1 cm: 0

Number of spongiform nodules >/=  2 cm not described below (TR1): 0

Number of mixed cystic and solid nodules >/= 1.5 cm not described
below (TR2): 0

_________________________________________________________

No discrete nodules are seen within the thyroid gland.

No adenopathy. Lymph nodes present demonstrate typical architecture.
IMPRESSION: Unremarkable sonographic survey of the thyroid.

Imaged lymph nodes are typical appearing, potentially reactive
though nonspecific.

## 2021-05-15 DIAGNOSIS — M47812 Spondylosis without myelopathy or radiculopathy, cervical region: Secondary | ICD-10-CM | POA: Diagnosis not present

## 2021-05-15 DIAGNOSIS — G894 Chronic pain syndrome: Secondary | ICD-10-CM | POA: Diagnosis not present

## 2021-05-15 DIAGNOSIS — Z79891 Long term (current) use of opiate analgesic: Secondary | ICD-10-CM | POA: Diagnosis not present

## 2021-05-15 DIAGNOSIS — M961 Postlaminectomy syndrome, not elsewhere classified: Secondary | ICD-10-CM | POA: Diagnosis not present

## 2021-06-14 DIAGNOSIS — Z79899 Other long term (current) drug therapy: Secondary | ICD-10-CM | POA: Diagnosis not present

## 2021-06-14 DIAGNOSIS — R000 Tachycardia, unspecified: Secondary | ICD-10-CM | POA: Diagnosis not present

## 2021-06-14 DIAGNOSIS — F419 Anxiety disorder, unspecified: Secondary | ICD-10-CM | POA: Diagnosis not present

## 2021-06-14 DIAGNOSIS — K08109 Complete loss of teeth, unspecified cause, unspecified class: Secondary | ICD-10-CM | POA: Diagnosis not present

## 2021-07-12 DIAGNOSIS — G894 Chronic pain syndrome: Secondary | ICD-10-CM | POA: Diagnosis not present

## 2021-07-12 DIAGNOSIS — Z79891 Long term (current) use of opiate analgesic: Secondary | ICD-10-CM | POA: Diagnosis not present

## 2021-07-12 DIAGNOSIS — M961 Postlaminectomy syndrome, not elsewhere classified: Secondary | ICD-10-CM | POA: Diagnosis not present

## 2021-07-12 DIAGNOSIS — M47812 Spondylosis without myelopathy or radiculopathy, cervical region: Secondary | ICD-10-CM | POA: Diagnosis not present

## 2021-07-16 DIAGNOSIS — I700 Atherosclerosis of aorta: Secondary | ICD-10-CM | POA: Diagnosis not present

## 2021-07-16 DIAGNOSIS — M797 Fibromyalgia: Secondary | ICD-10-CM | POA: Diagnosis not present

## 2021-07-16 DIAGNOSIS — G894 Chronic pain syndrome: Secondary | ICD-10-CM | POA: Diagnosis not present

## 2021-07-16 DIAGNOSIS — F17211 Nicotine dependence, cigarettes, in remission: Secondary | ICD-10-CM | POA: Diagnosis not present

## 2021-07-16 DIAGNOSIS — R911 Solitary pulmonary nodule: Secondary | ICD-10-CM | POA: Diagnosis not present

## 2021-07-16 DIAGNOSIS — I10 Essential (primary) hypertension: Secondary | ICD-10-CM | POA: Diagnosis not present

## 2021-07-16 DIAGNOSIS — R000 Tachycardia, unspecified: Secondary | ICD-10-CM | POA: Diagnosis not present

## 2021-07-16 DIAGNOSIS — Z79899 Other long term (current) drug therapy: Secondary | ICD-10-CM | POA: Diagnosis not present

## 2021-07-16 DIAGNOSIS — J310 Chronic rhinitis: Secondary | ICD-10-CM | POA: Diagnosis not present

## 2021-08-29 ENCOUNTER — Ambulatory Visit: Payer: Medicare PPO | Admitting: Nurse Practitioner

## 2021-08-30 ENCOUNTER — Ambulatory Visit: Payer: Medicare PPO | Admitting: Family Medicine

## 2021-08-30 ENCOUNTER — Encounter: Payer: Self-pay | Admitting: Family Medicine

## 2021-08-30 VITALS — BP 146/88 | HR 88 | Temp 98.0°F | Ht 64.0 in | Wt 119.0 lb

## 2021-08-30 DIAGNOSIS — F419 Anxiety disorder, unspecified: Secondary | ICD-10-CM

## 2021-08-30 DIAGNOSIS — G894 Chronic pain syndrome: Secondary | ICD-10-CM

## 2021-08-30 DIAGNOSIS — Z9109 Other allergy status, other than to drugs and biological substances: Secondary | ICD-10-CM

## 2021-08-30 MED ORDER — CROMOLYN SODIUM 4 % OP SOLN: 1.0000 [drp] | Freq: Four times a day (QID) | OPHTHALMIC | 0 refills | Status: AC

## 2021-08-30 MED ORDER — MONTELUKAST SODIUM 10 MG PO TABS: 10.0000 mg | ORAL_TABLET | Freq: Every day | ORAL | 5 refills | Status: AC

## 2021-08-30 NOTE — Patient Instructions (Signed)
You can call to schedule your appointment with a psychiatrist.  A few offices are listed below for you to call.   [facility]  [address], [place], [place] [address]  Phone: [phone]    [facility] [address] [place], [place] [address]  Phone: [phone]     [facility] [address] Sonny Masters [place], [place] [address] [phone]

## 2021-08-30 NOTE — Progress Notes (Signed)
New Patient Office Visit  Subjective    Patient ID: Margaret Dominguez, female    DOB: 1955-09-22  Age: 66 y.o. MRN: [id number]  CC:  Chief Complaint  Patient presents with   Establish Care    HPI Margaret Dominguez presents to establish care.   No medial records available today. She has been an [facility] patient.   Dr. Vear Clock prescrbes Nycynta and Zanaflex.   Dr Radium Callas is her allergist.   States she has seen a neurologist in the past.   States she was seeing  a court ordered psychiatrist but is no longer seeing them.  She is requesting that I start prescribing clonazepam. She is not seeing a therapist.   It is difficult to keep patient on topic.     Outpatient Encounter Medications as of 08/30/2021  Medication Sig   [DISCONTINUED] cromolyn (OPTICROM) 4 % ophthalmic solution 4 (four) times daily.   albuterol (PROVENTIL) (2.5 MG/3ML) 0.083% nebulizer solution Take 2.5 mg by nebulization every 6 (six) hours as needed for wheezing or shortness of breath.   Biotin 1 MG CAPS Take by mouth daily.   calcium carbonate (CALCIUM 600) 600 MG TABS tablet Take 600 mg by mouth 2 (two) times daily with a meal.   calcium carbonate (TUMS - DOSED IN MG ELEMENTAL CALCIUM) 500 MG chewable tablet Chew 1 tablet by mouth daily.   clonazePAM (KLONOPIN) 2 MG tablet Take 2 mg by mouth 4 (four) times daily.    cromolyn (OPTICROM) 4 % ophthalmic solution Place 1 drop into both eyes 4 (four) times daily.   diclofenac sodium (VOLTAREN) 1 % GEL Apply topically 4 (four) times daily.   fexofenadine (ALLEGRA) 180 MG tablet Take 180 mg by mouth daily.   fluticasone (FLONASE) 50 MCG/ACT nasal spray Place into both nostrils daily.   mometasone (ASMANEX) 220 MCG/INH inhaler Inhale 2 puffs into the lungs daily.   montelukast (SINGULAIR) 10 MG tablet Take 1 tablet (10 mg total) by mouth at bedtime.   tapentadol (NUCYNTA) 50 MG TABS tablet Take 50 mg by mouth 2 (two) times daily.   tiZANidine (ZANAFLEX) 4 MG capsule  Take 4 mg by mouth 3 (three) times daily as needed for muscle spasms.   verapamil (VERELAN PM) 240 MG 24 hr capsule Take 240 mg by mouth at bedtime.   vitamin E 400 UNIT capsule Take 400 Units by mouth daily.   [DISCONTINUED] buPROPion (WELLBUTRIN SR) 100 MG 12 hr tablet Take 100 mg by mouth 2 (two) times daily.   [DISCONTINUED] montelukast (SINGULAIR) 10 MG tablet Take 10 mg by mouth at bedtime.   [DISCONTINUED] venlafaxine XR (EFFEXOR-XR) 75 MG 24 hr capsule Take 75 mg by mouth daily with breakfast.   No facility-administered encounter medications on file as of 08/30/2021.    Past Medical History:  Diagnosis Date   Asthma    Chronic pain syndrome    Depression    Seasonal allergies      No family history on file.  Social History   Socioeconomic History   Marital status: Married    Spouse name: Not on file   Number of children: Not on file   Years of education: Not on file   Highest education level: Not on file  Occupational History   Not on file  Tobacco Use   Smoking status: Former   Smokeless tobacco: Not on file  Substance and Sexual Activity   Alcohol use: No    Alcohol/week: 0.0 standard drinks  Drug use: No   Sexual activity: Not on file  Other Topics Concern   Not on file  Social History Narrative   Not on file   Social Determinants of Health   Financial Resource Strain: Not on file  Food Insecurity: Not on file  Transportation Needs: Not on file  Physical Activity: Not on file  Stress: Not on file  Social Connections: Not on file  Intimate Partner Violence: Not on file    ROS      Objective    BP (!) 146/88 (BP Location: Right Arm, Patient Position: Sitting, Cuff Size: Large)   Pulse 88   Temp 98 F (36.7 C) (Temporal)   Ht 5\' 4"  (1.626 m)   Wt 119 lb (54 kg)   SpO2 99%   BMI 20.43 kg/m   Physical Exam      Assessment & Plan:   Problem List Items Addressed This Visit   None Visit Diagnoses     Anxiety    -  Primary    Chronic pain syndrome       Multiple environmental allergies       Relevant Medications   montelukast (SINGULAIR) 10 MG tablet      New patient today and no medical records available. I will request these from [facility].  It is quite difficult to keep her on topic.  She will continue seeing pain management.  Advised her that she will need to schedule with a new psychiatrist or go back to her old one in order to continue clonazepam as she has been taking it. She is aware that I will not prescribe this for her.  Continue allergy and asthma medications.  Follow up once I have her records.   No follow-ups on file.   [facility], NP-C

## 2021-09-06 ENCOUNTER — Telehealth: Payer: Self-pay | Admitting: Family Medicine

## 2021-09-06 NOTE — Telephone Encounter (Signed)
Please call patient concerning her mental health

## 2021-09-07 NOTE — Telephone Encounter (Signed)
Called pt and discussed where she was at in finding herself a new psychiatrist. She still has not been able to get a refill of her ClonazePAM.

## 2021-09-28 ENCOUNTER — Ambulatory Visit (HOSPITAL_COMMUNITY)
Admission: EM | Admit: 2021-09-28 | Discharge: 2021-09-28 | Disposition: A | Discharge status: Home / Self Care | Payer: Medicare PPO | Attending: Psychiatry | Admitting: Psychiatry

## 2021-09-28 ENCOUNTER — Ambulatory Visit
Admission: EM | Admit: 2021-09-28 | Discharge: 2021-09-28 | Disposition: A | Discharge status: Home / Self Care | Payer: Medicare PPO

## 2021-09-28 DIAGNOSIS — F4320 Adjustment disorder, unspecified: Secondary | ICD-10-CM | POA: Insufficient documentation

## 2021-09-28 DIAGNOSIS — Z760 Encounter for issue of repeat prescription: Secondary | ICD-10-CM | POA: Diagnosis not present

## 2021-09-28 DIAGNOSIS — F4329 Adjustment disorder with other symptoms: Secondary | ICD-10-CM | POA: Diagnosis not present

## 2021-09-28 NOTE — Discharge Instructions (Signed)
I am not able to provide you a refill on your clonazepam.  Please go to behavioral health urgent care at provided address as they may be able to help you.  Or you may follow-up with your primary care.

## 2021-09-28 NOTE — ED Provider Notes (Signed)
Behavioral Health Urgent Care Medical Screening Exam  Patient Name: Margaret Dominguez MRN: [id number] Date of Evaluation: 09/28/21 Chief Complaint:   Diagnosis:  Final diagnoses:  Adjustment disorder with other symptom    History of Present illness: Margaret Dominguez is a 66 y.o. female. Patient presents voluntarily to [facility] behavioral health for walk-in assessment.  Patient is accompanied by her husband who does not remain present during assessment.   Margaret Dominguez presents to [facility] behavioral health today recommended by urgent care provider.  She reports she is seeking refill for her clonazepam.  She reports she last took clonazepam approximately 3 months ago in March 2023.  She reports she has taken clonazepam for many years, clonazepam originally prescribed by cardiologist related to tachycardia.  She began seeing a new primary care provider several months ago, his primary care provider would like for patient to be followed by outpatient psychiatry for future clonazepam prescriptions.  Patient has an appointment with [facility] on Monday 10/01/2021 at noon.  Patient reports chronic insomnia for many years states "I never slept well."  She reports clonazepam is effective in helping her sleep improve.  Margaret Dominguez reports recent stressors include the death of her 2 stepsons over the past 3 years.  Additional stressors include her husband's cancer diagnosis 1 year ago.  Patient's husband is now recovering from cancer.  Attempted to discuss potential outpatient counseling.  Patient states "I do not need a therapist I just need my clonazepam."  She reports she has several family members and friends who act as emotional supports.  She states "my husband is the best therapist."  Chronic stressors include history of medical diagnoses and treatments.  Patient reports she is currently followed by "14 specialists."  She denies mental health diagnoses, per medical record  patient has been diagnosed with depression.  She reports she has been prescribed bupropion in the past related to low energy and fatigue however she felt that bupropion caused her heart to race.  She denies current medications to address mood.  She denies history of inpatient psychiatric hospitalization.  No family mental health history reported.  She is not currently linked with outpatient psychiatry.  She reports she was followed by Dr. Milagros Evener for many years however stopped following up with Dr. Evelene Croon 1 year ago related to an incident resulting in her medication not being called into her pharmacy.  She denies that Dr. Evelene Croon treated any specific diagnosis.  She reports she was initially court ordered to see outpatient psychiatry for 1 year related to multiple physical health concerns, then followed with Dr. Evelene Croon for several years for medication management only.  Patient is assessed face-to-face by nurse practitioner.  She is seated in assessment area, no acute distress.  She is alert and oriented, pleasant and cooperative during assessment.  She presents with euthymic mood, congruent affect. She denies suicidal and homicidal ideations.  She denies history of suicide attempts, denies history of non suicidal self-harm behavior.  She contracts verbally for safety with this Clinical research associate. She has rapid speech. She has normal behavior.  She denies auditory and visual hallucinations.  Patient is able to converse coherently with goal-directed thoughts.  She denies paranoia.  Objectively there is no evidence of psychosis/mania or delusional thinking.  Margaret Dominguez resides in [place] with her husband. She is a retired Child psychotherapist. Patient endorses chronic decreased sleep and appetite. She denies alcohol and substance use.  Patient offered support and encouragement.    Patient and family are  educated and verbalize understanding of mental health resources and other crisis services in the community. They are  instructed to call 911 and present to the nearest emergency room should patient experience any suicidal/homicidal ideation, auditory/visual/hallucinations, or detrimental worsening of mental health condition.      Psychiatric Specialty Exam  Presentation  General Appearance:Casual; Appropriate for Environment; Neat; Well Groomed  Eye Contact:Good  Speech:Clear and Coherent; Normal Rate  Speech Volume:Normal  Handedness:Right   Mood and Affect  Mood:Euthymic  Affect:Appropriate; Congruent   Thought Process  Thought Processes:Coherent; Goal Directed; Linear  Descriptions of Associations:Intact  Orientation:Full (Time, Place and Person)  Thought Content:Logical; WDL    Hallucinations:None  Ideas of Reference:None  Suicidal Thoughts:No  Homicidal Thoughts:No   Sensorium  Memory:Immediate Good; Recent Good  Judgment:Good  Insight:Fair   Executive Functions  Concentration:Good  Attention Span:Good  Recall:Good  Fund of Knowledge:Good  Language:Good   Psychomotor Activity  Psychomotor Activity:Normal   Assets  Assets:Communication Skills; Desire for Improvement; Financial Resources/Insurance; Housing; Intimacy; Leisure Time; Resilience; Social Support   Sleep  Sleep:Poor  Number of hours: No data recorded  No data recorded  Physical Exam: Physical Exam Vitals and nursing note reviewed.  Constitutional:      Appearance: Normal appearance. She is well-developed and normal weight.  HENT:     Head: Normocephalic and atraumatic.     Nose: Nose normal.  Cardiovascular:     Rate and Rhythm: Normal rate.  Pulmonary:     Effort: Pulmonary effort is normal.  Musculoskeletal:        General: Normal range of motion.     Cervical back: Normal range of motion.  Skin:    General: Skin is warm and dry.  Neurological:     Mental Status: She is alert and oriented to person, place, and time.  Psychiatric:        Attention and Perception: Attention  and perception normal.        Mood and Affect: Mood and affect normal.        Speech: Speech normal.        Behavior: Behavior normal. Behavior is cooperative.        Thought Content: Thought content normal.        Cognition and Memory: Cognition and memory normal.   Review of Systems  Constitutional: Negative.   HENT: Negative.    Eyes: Negative.   Respiratory: Negative.    Cardiovascular: Negative.   Gastrointestinal: Negative.   Genitourinary: Negative.   Musculoskeletal: Negative.   Skin: Negative.   Neurological: Negative.   Endo/Heme/Allergies: Negative.   Psychiatric/Behavioral: Negative.     Blood pressure (!) 153/107, pulse 100, temperature 98 F (36.7 C), temperature source Oral, resp. rate 18, SpO2 98 %. There is no height or weight on file to calculate BMI.  Musculoskeletal: Strength & Muscle Tone: within normal limits Gait & Station: normal Patient leans: N/A   [facility] MSE Discharge Disposition for Follow up and Recommendations: Based on my evaluation the patient does not appear to have an emergency medical condition and can be discharged with resources and follow up care in outpatient services for Medication Management and Individual Therapy Patient reviewed with Dr Bronwen Betters.  Follow up with outpatient psychiatry, resources provided.  Follow up with primary care provider. Continue current medications.   Lenard Lance, FNP 09/28/2021, 4:38 PM

## 2021-09-28 NOTE — ED Triage Notes (Signed)
Pt here for rx refill clonazepam to last through Monday

## 2021-09-28 NOTE — ED Triage Notes (Signed)
Pt presents to [facility] requesting a medication refill for her clonazepam to last through the weekend. Pt denies SI/HI and AVH.

## 2021-09-28 NOTE — BH Assessment (Signed)
LCSW Progress Note  Per Tina L. Freida Busman, FNP, this pt does not require psychiatric hospitalization at this time.  Pt is psychiatrically cleared.  Discharge instructions include several resource for outpatient medication management and therapy along with the mobile crisis number.  EDP Tina L. Freida Busman, FNP, has been notified.  Hansel Starling, MSW, LCSW [facility] [phone] or [phone]

## 2021-09-28 NOTE — Discharge Instructions (Addendum)
Base on observation and your needs, outpatient psychiatric and therapy services have been recommended.  It is imperative to your mental health that seek out services within a week from the day of discharge to prevent another crisis. A list of referrals have been provided below based on your needs and recommendations.  You are not limited to the list provided.  In case of an urgent crisis, you may contact the Mobile Crisis Unit with [facility] at [phone].   For your behavioral health needs you are advised to follow up with [facility] at your earliest opportunity:       [facility]      [address] SECOND FLOOR      [place], [place] [address]      [phone]       They offer psychiatry/medication management and therapy.  New patients are seen in their walk-in clinic.  Walk-in hours are Monday, Wednesday, Thursday and Friday from 8:00 am - 11:00 am for psychiatry, and Monday and Wednesday from 8:00 am - 11:00 am for therapy.  Walk-in patients are seen on a first come, first served basis, so try to arrive as early as possible for the best chance of being seen the same day.  BE SURE TO TAKE THE ELEVATOR TO THE SECOND FLOOR.  Please note that to be eligible for services you must bring an ID or a piece of mail with your name and a [facility] address.        Archer Asa, MD      [facility]      [address]      [place], [place] [address]      [phone]       Neila Gear, MD      [facility]      [address]      [place], [place] [address]      [phone]      [facility]     [address]     [place], [place], [address]     [phone] phone     [phone] fax     [facility]    [address]    [place], [place], [address]    [phone] phone

## 2021-09-28 NOTE — ED Provider Notes (Signed)
[facility]    CSN: [id number] Arrival date & time: 09/28/21  1339      History   Chief Complaint Chief Complaint  Patient presents with   Medication Refill    HPI Margaret Dominguez is a 66 y.o. female.   Patient presents requesting medication refill for clonazepam.  Patient reports that she only needs a few pills to make it until Monday as she has an appointment with a new doctor then.  It is very hard to follow patient's story as she is telling multiple different stories at one time.  Patient reports that she has been taking clonazepam for multiple years and states that she was started on it due to tachycardia after having a "tumor with teeth" removed from her chest when she was younger.  She states that she was last prescribed this in March but I am not able to ascertain which provider prescribed this as she naming multiple different doctors during history.  She denies that she has any anxiety and the clonazepam is not for anxiety.  Patient is also followed by pain management for chronic pain.  Patient states that her PCP referred her to urgent care to have medication refilled.   Medication Refill   Past Medical History:  Diagnosis Date   Asthma    Chronic pain syndrome    Depression    Seasonal allergies     There are no problems to display for this patient.   Past Surgical History:  Procedure Laterality Date   ABDOMINAL HYSTERECTOMY     CARPAL TUNNEL RELEASE Bilateral    CERVICAL SPINE SURGERY     DILATION AND CURETTAGE OF UTERUS     HEMATOMA EVACUATION     RESECTION OF A THYMOMA     SINUS EXPLORATION     STOMACH SURGERY  x2   cut vagus nerve - repair   THORACIC SPINE SURGERY      OB History   No obstetric history on file.      Home Medications    Prior to Admission medications   Medication Sig Start Date End Date Taking? Authorizing Provider  albuterol (PROVENTIL) (2.5 MG/3ML) 0.083% nebulizer solution Take 2.5 mg by nebulization every 6  (six) hours as needed for wheezing or shortness of breath.    [provider]  Biotin 1 MG CAPS Take by mouth daily.    [provider]  calcium carbonate (CALCIUM 600) 600 MG TABS tablet Take 600 mg by mouth 2 (two) times daily with a meal.    [provider]  calcium carbonate (TUMS - DOSED IN MG ELEMENTAL CALCIUM) 500 MG chewable tablet Chew 1 tablet by mouth daily.    [provider]  clonazePAM (KLONOPIN) 2 MG tablet Take 2 mg by mouth 4 (four) times daily.     [provider]  cromolyn (OPTICROM) 4 % ophthalmic solution Place 1 drop into both eyes 4 (four) times daily. 08/30/21   Henson, Vickie L, NP-C  diclofenac sodium (VOLTAREN) 1 % GEL Apply topically 4 (four) times daily.    [provider]  fexofenadine (ALLEGRA) 180 MG tablet Take 180 mg by mouth daily.    [provider]  fluticasone (FLONASE) 50 MCG/ACT nasal spray Place into both nostrils daily.    [provider]  mometasone [facility]) 220 MCG/INH inhaler Inhale 2 puffs into the lungs daily.    [provider]  montelukast (SINGULAIR) 10 MG tablet Take 1 tablet (10 mg total) by mouth  at bedtime. 08/30/21   Henson, Vickie L, NP-C  tapentadol (NUCYNTA) 50 MG TABS tablet Take 50 mg by mouth 2 (two) times daily.    [provider]  tiZANidine (ZANAFLEX) 4 MG capsule Take 4 mg by mouth 3 (three) times daily as needed for muscle spasms.    [provider]  verapamil (VERELAN PM) 240 MG 24 hr capsule Take 240 mg by mouth at bedtime.    [provider]  vitamin E 400 UNIT capsule Take 400 Units by mouth daily.    [provider]    Family History History reviewed. No pertinent family history.  Social History Social History   Tobacco Use   Smoking status: Former  Substance Use Topics   Alcohol use: No    Alcohol/week: 0.0 standard drinks of alcohol   Drug use: No     Allergies   Dilaudid [hydromorphone],  Guaifenesin, Levaquin [levofloxacin in d5w], Levofloxacin, Nsaids, and Prednisone   Review of Systems Review of Systems Per HPI  Physical Exam Triage Vital Signs ED Triage Vitals [09/28/21 1349]  Enc Vitals Group     BP (!) 164/100     Pulse Rate 96     Resp 18     Temp 98.6 F (37 C)     Temp Source Oral     SpO2 95 %     Weight      Height      Head Circumference      Peak Flow      Pain Score 0     Pain Loc      Pain Edu?      Excl. in GC?    No data found.  Updated Vital Signs BP (!) 164/100 (BP Location: Left Arm)   Pulse 96   Temp 98.6 F (37 C) (Oral)   Resp 18   SpO2 95%   Visual Acuity Right Eye Distance:   Left Eye Distance:   Bilateral Distance:    Right Eye Near:   Left Eye Near:    Bilateral Near:     Physical Exam Constitutional:      General: She is not in acute distress.    Appearance: Normal appearance. She is not toxic-appearing or diaphoretic.  HENT:     Head: Normocephalic and atraumatic.  Eyes:     Extraocular Movements: Extraocular movements intact.     Conjunctiva/sclera: Conjunctivae normal.  Pulmonary:     Effort: Pulmonary effort is normal.  Neurological:     General: No focal deficit present.     Mental Status: She is alert and oriented to person, place, and time. Mental status is at baseline.  Psychiatric:        Mood and Affect: Mood normal.        Behavior: Behavior normal.        Thought Content: Thought content normal.        Judgment: Judgment normal.      UC Treatments / Results  Labs (all labs ordered are listed, but only abnormal results are displayed) Labs Reviewed - No data to display  EKG   Radiology No results found.  Procedures Procedures (including critical care time)  Medications Ordered in UC Medications - No data to display  Initial Impression / Assessment and Plan / UC Course  I have reviewed the triage vital signs and the nursing notes.  Pertinent labs & imaging results that were  available during my care of the patient were reviewed by me  and considered in my medical decision making (see chart for details).     There are multiple inconsistencies in patient's health history which does not help ascertain what patient takes medication for her and who prescribes it.  After further review of the chart, patient had an appointment to establish care with PCP on 5/18 and they declined to refill clonazepam and patient was instructed to follow-up with a psychiatrist to have this refilled.  Patient also is followed by pain management.  Review of PDMP reveals that patient last had medication refilled in March for 30 days.  Given all of these factors, I have chosen to not refill patient's clonazepam and referred her to behavioral health urgent care as they may be able to help as well as advised patient to follow-up with PCP for this.  Patient verbalized understanding and was agreeable with plan. Final Clinical Impressions(s) / UC Diagnoses   Final diagnoses:  Medication refill     Discharge Instructions      I am not able to provide you a refill on your clonazepam.  Please go to behavioral health urgent care at provided address as they may be able to help you.  Or you may follow-up with your primary care.    ED Prescriptions   None    I have reviewed the PDMP during this encounter.   Gustavus Bryant, [place] 09/28/21 1423

## 2021-10-01 ENCOUNTER — Ambulatory Visit: Payer: Medicare PPO | Admitting: Behavioral Health

## 2021-10-09 ENCOUNTER — Other Ambulatory Visit: Payer: Self-pay | Admitting: Family Medicine

## 2021-10-09 DIAGNOSIS — R911 Solitary pulmonary nodule: Secondary | ICD-10-CM

## 2021-10-17 ENCOUNTER — Encounter: Payer: Self-pay | Admitting: Physician Assistant

## 2021-10-17 ENCOUNTER — Ambulatory Visit (INDEPENDENT_AMBULATORY_CARE_PROVIDER_SITE_OTHER): Payer: Medicare PPO | Admitting: Physician Assistant

## 2021-10-17 ENCOUNTER — Ambulatory Visit: Payer: Medicare PPO | Admitting: Physician Assistant

## 2021-10-17 VITALS — BP 162/98 | HR 86 | Ht 61.5 in | Wt 115.2 lb

## 2021-10-17 DIAGNOSIS — F330 Major depressive disorder, recurrent, mild: Secondary | ICD-10-CM

## 2021-10-17 DIAGNOSIS — D649 Anemia, unspecified: Secondary | ICD-10-CM | POA: Insufficient documentation

## 2021-10-17 DIAGNOSIS — I700 Atherosclerosis of aorta: Secondary | ICD-10-CM | POA: Insufficient documentation

## 2021-10-17 DIAGNOSIS — F1320 Sedative, hypnotic or anxiolytic dependence, uncomplicated: Secondary | ICD-10-CM | POA: Insufficient documentation

## 2021-10-17 DIAGNOSIS — G894 Chronic pain syndrome: Secondary | ICD-10-CM | POA: Diagnosis not present

## 2021-10-17 DIAGNOSIS — K219 Gastro-esophageal reflux disease without esophagitis: Secondary | ICD-10-CM | POA: Insufficient documentation

## 2021-10-17 DIAGNOSIS — K589 Irritable bowel syndrome without diarrhea: Secondary | ICD-10-CM

## 2021-10-17 DIAGNOSIS — G4700 Insomnia, unspecified: Secondary | ICD-10-CM

## 2021-10-17 DIAGNOSIS — I10 Essential (primary) hypertension: Secondary | ICD-10-CM | POA: Insufficient documentation

## 2021-10-17 DIAGNOSIS — F411 Generalized anxiety disorder: Secondary | ICD-10-CM | POA: Diagnosis not present

## 2021-10-17 DIAGNOSIS — N183 Chronic kidney disease, stage 3 unspecified: Secondary | ICD-10-CM | POA: Insufficient documentation

## 2021-10-17 DIAGNOSIS — E039 Hypothyroidism, unspecified: Secondary | ICD-10-CM

## 2021-10-17 DIAGNOSIS — R911 Solitary pulmonary nodule: Secondary | ICD-10-CM | POA: Insufficient documentation

## 2021-10-17 DIAGNOSIS — J320 Chronic maxillary sinusitis: Secondary | ICD-10-CM | POA: Insufficient documentation

## 2021-10-17 DIAGNOSIS — J45909 Unspecified asthma, uncomplicated: Secondary | ICD-10-CM | POA: Insufficient documentation

## 2021-10-17 DIAGNOSIS — J302 Other seasonal allergic rhinitis: Secondary | ICD-10-CM | POA: Insufficient documentation

## 2021-10-17 DIAGNOSIS — F32A Depression, unspecified: Secondary | ICD-10-CM | POA: Insufficient documentation

## 2021-10-17 MED ORDER — BUSPIRONE HCL 15 MG PO TABS: ORAL_TABLET | ORAL | 1 refills | Status: DC

## 2021-10-17 MED ORDER — CLONAZEPAM 1 MG PO TABS: 1.0000 mg | ORAL_TABLET | Freq: Four times a day (QID) | ORAL | 0 refills | Status: DC | PRN

## 2021-10-17 NOTE — Progress Notes (Signed)
[facility] MD/PA/NP Initial Note  10/17/2021 5:59 PM Margaret Dominguez  MRN:  [id number]  Chief Complaint:  Chief Complaint   Establish Care     HPI:   Was seeing Dr. Shirlean Mylar who left the practice and needs someone to write the Klonopin. One of the providers in that same office gave her enough until she could get to this appointment.  She had been taking Klonopin 2 mg 4 times daily for what sounds like years.  It is very difficult to get accurate information from this patient, she rambles a lot and constantly has to be redirected.  At any rate she has been on 1 mg 4 times daily for a couple of months, and has tolerated that well.  Patient states that she was originally put on Klonopin years ago for her heart.  It was not for anxiety at all.  She continued to take it and it "helped my heart."  The times she has tried to stop it she has panic attacks, her heart will race, her hands will get sweaty and she feels short of breath.  She has not tried that recently although states that she went down from 2 mg 4 times daily to 1 mg 4 times daily "I was cut off, just like that."  She had no withdrawals from that decrease.  Patient denies loss of interest in usual activities and is able to enjoy things.  Denies decreased energy.  Denies decreased motivation.  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  ADLs and personal hygiene are normal.  Appetite has not changed.  Weight is stable.  Denies laxative use, calorie restricting, or binging and purging.  No extreme sadness, tearfulness, or feelings of hopelessness.  Sleeps well most of the time.  Denies any changes in concentration, making decisions or remembering things.  Denies cutting or any form of self-harm.  Denies suicidal or homicidal thoughts.  Patient denies increased energy with decreased need for sleep, no  increased talkativeness, no racing thoughts, no impulsivity or risky behaviors, no increased spending, no increased libido, no grandiosity, no increased irritability or anger, no paranoia, and no hallucinations.  Visit Diagnosis:    ICD-10-CM   1. Generalized anxiety disorder  F41.1     2. Chronic pain syndrome  G89.4     3. 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 Benzodiazepine dependence (HCC)  F13.20     5. Insomnia, unspecified type  G47.00     6. Hypothyroidism, unspecified type  E03.9     7. Irritable bowel syndrome, unspecified type  K58.9      Past Psychiatric History:   No psych hospitalizations. Never attempted suicide.   Past medications for mental health diagnoses include: Wellbutrin caused tachycardia, Klonopin  Past Medical History:  Past Medical History:  Diagnosis Date   Anemia    Anxiety    Aortic atherosclerosis (HCC)    Asthma    Benzodiazepine dependence (HCC)    Chronic kidney disease    Chronic pain syndrome    Chronic sinusitis    Depression    GERD (gastroesophageal reflux disease)    Hypertension    IBS (irritable bowel syndrome)    Insomnia    Lung nodule    Seasonal allergies    Thyroid disease     Past  Surgical History:  Procedure Laterality Date   ABDOMINAL HYSTERECTOMY     CARPAL TUNNEL RELEASE Bilateral    CERVICAL SPINE SURGERY     DILATION AND CURETTAGE OF UTERUS     HEMATOMA EVACUATION     RESECTION OF A THYMOMA     SINUS EXPLORATION     STOMACH SURGERY  x2   cut vagus nerve - repair   THORACIC SPINE SURGERY      Family Psychiatric History: see below  Family History:  Family History  Problem Relation Age of Onset   Stroke Mother    Heart attack Father    Healthy Brother     Social History:  Social History   Socioeconomic History   Marital status: Married    Spouse name: Not on file   Number of children: 0   Years of education: Not on file   Highest education level: Bachelor's degree (e.g., BA,  AB, BS)  Occupational History   Not on file  Tobacco Use   Smoking status: Former   Smokeless tobacco: Not on file  Substance and Sexual Activity   Alcohol use: No    Alcohol/week: 0.0 standard drinks of alcohol   Drug use: No   Sexual activity: Not on file  Other Topics Concern   Not on file  Social History Narrative   Married 37 years. No bio kids. Raised her husbands kids.   Graduated from [employer]. 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 Put on disability in 2001 for physical health.    Grew up in [place]. Never abused.       Legal-no   Religion-[employer]   Caffeine-0-1      Social Determinants of Health   Financial Resource Strain: Not on file  Food Insecurity: No Food Insecurity (10/17/2021)   Hunger Vital Sign    Worried About Running Out of Food in the Last Year: Never true    Ran Out of Food in the Last Year: Never true  Transportation Needs: No Transportation Needs (10/17/2021)   PRAPARE - Administrator, Civil Service (Medical): No    Lack of Transportation (Non-Medical): No  Physical Activity: Sufficiently Active (10/17/2021)   Exercise Vital Sign    Days of Exercise per Week: 7 days    Minutes of Exercise per Session: 40 min  Stress: No Stress Concern Present (10/17/2021)   [employer] of Occupational Health - Occupational Stress Questionnaire    Feeling of Stress : Only a little  Social Connections: Socially Integrated (10/17/2021)   Social Connection and Isolation Panel [NHANES]    Frequency of Communication with Friends and Family: More than three times a week    Frequency of Social Gatherings with Friends and Family: More than three times a week    Attends Religious Services: More than 4 times per year    Active Member of [employer] or Organizations: Yes    Attends Engineer, structural: More than 4 times per year    Marital Status: Married    Allergies:  Allergies  Allergen  Reactions   Dilaudid [Hydromorphone]    Guaifenesin     Other reaction(s): increased HR   Levaquin [Levofloxacin In D5w]    Levofloxacin     Other reaction(s): rash   Nsaids     Other reaction(s): GI upset   Prednisone     Metabolic Disorder Labs: No results found for: "HGBA1C", "MPG" No results  found for: "PROLACTIN" No results found for: "CHOL", "TRIG", "HDL", "CHOLHDL", "VLDL", "LDLCALC" No results found for: "TSH"  Therapeutic Level Labs: No results found for: "LITHIUM" No results found for: "VALPROATE" No results found for: "CBMZ"  Current Medications: Current Outpatient Medications  Medication Sig Dispense Refill   albuterol (PROVENTIL) (2.5 MG/3ML) 0.083% nebulizer solution Take 2.5 mg by nebulization every 6 (six) hours as needed for wheezing or shortness of breath.     Biotin 1 MG CAPS Take by mouth daily.     busPIRone (BUSPAR) 15 MG tablet Take  1/3 tablet twice daily for 1 week, then increase to 2/3 tablet twice daily for 1 week, then increase to 1 tablet twice daily for anxiety. 60 tablet 1   calcium carbonate (CALCIUM 600) 600 MG TABS tablet Take 600 mg by mouth 2 (two) times daily with a meal.     calcium carbonate (TUMS - DOSED IN MG ELEMENTAL CALCIUM) 500 MG chewable tablet Chew 1 tablet by mouth daily.     clonazePAM (KLONOPIN) 1 MG tablet Take 1 tablet (1 mg total) by mouth 4 (four) times daily as needed for anxiety. 120 tablet 0   cromolyn (OPTICROM) 4 % ophthalmic solution Place 1 drop into both eyes 4 (four) times daily. 10 mL 0   diclofenac sodium (VOLTAREN) 1 % GEL Apply topically 4 (four) times daily.     fexofenadine (ALLEGRA) 180 MG tablet Take 180 mg by mouth daily.     fluticasone (FLONASE) 50 MCG/ACT nasal spray Place into both nostrils daily.     mometasone (ASMANEX) 220 MCG/INH inhaler Inhale 2 puffs into the lungs daily.     montelukast (SINGULAIR) 10 MG tablet Take 1 tablet (10 mg total) by mouth at bedtime. 30 tablet 5   tapentadol (NUCYNTA) 50  MG TABS tablet Take 50 mg by mouth 2 (two) times daily.     tiZANidine (ZANAFLEX) 4 MG capsule Take 4 mg by mouth 3 (three) times daily as needed for muscle spasms.     verapamil (VERELAN PM) 240 MG 24 hr capsule Take 240 mg by mouth at bedtime.     vitamin E 400 UNIT capsule Take 400 Units by mouth daily.     No current facility-administered medications for this visit.    Medication Side Effects: none  Orders placed this visit:  No orders of the defined types were placed in this encounter.   Psychiatric Specialty Exam:  Review of Systems  Constitutional: Negative.   HENT: Negative.    Eyes: Negative.   Respiratory: Negative.    Cardiovascular: Negative.   Gastrointestinal: Negative.   Endocrine: Negative.   Genitourinary: Negative.   Musculoskeletal: Negative.   Skin: Negative.   Allergic/Immunologic: Negative.   Neurological: Negative.   Hematological: Negative.   Psychiatric/Behavioral:         See HPI    Blood pressure (!) 162/98, pulse 86, height 5' 1.5" (1.562 m), weight 115 lb 3.2 oz (52.3 kg).Body mass index is 21.41 kg/m.  General Appearance: Casual and Well Groomed  Eye Contact:  Good  Speech:  Clear and Coherent, Pressured, and Talkative  Volume:  Normal  Mood:  Anxious  Affect:  Anxious  Thought Process:  Goal Directed, Irrelevant, and Descriptions of Associations: Tangential  Orientation:  Full (Time, Place, and Person)  Thought Content: Obsessions, Rumination, and Tangential   Suicidal Thoughts:  No  Homicidal Thoughts:  No  Memory:  WNL  Judgement:  Good  Insight:  Good  Psychomotor Activity:  Normal  Concentration:  Concentration: Good  Recall:  Good  Fund of Knowledge: Good  Language: Good  Assets:  Desire for Improvement Financial Resources/Insurance Housing Transportation Vocational/Educational  ADL's:  Intact  Cognition: WNL  Prognosis:  Good   Screenings:  GAD-7    Flowsheet Row Office Visit from 10/17/2021 in [facility]  Total GAD-7 Score 0      PHQ2-9    Flowsheet Row Office Visit from 10/17/2021 in [facility]  PHQ-2 Total Score 1      Flowsheet Row ED from 09/28/2021 in [facility]   C-SSRS RISK CATEGORY No Risk       Receiving Psychotherapy: No   Treatment Plan/Recommendations:  PDMP reviewed. Klonopin filled 10/03/2021. Nucynta per Dr. Thyra Breed.  I provided 60 minutes of face to face time during this encounter, including time spent before and after the visit in records review, medical decision making, counseling pertinent to today's visit, and charting.   It is difficult to know whether she has taken anything other than Klonopin for anxiety.  With almost any question I asked she would not directly answer it, I had to redirect numerous times.  From what I can gather though she has never tried BuSpar.  She may have taken an SSRI in the past but she is not sure.  She was told that it is against office policy to prescribe only a benzodiazepine, we discussed that it is a controlled substance, is addictive and she has built up a tolerance to it over time.  There are other meds to treat anxiety that aren't addictive and can help prevent it, not wait till the anxiety is so bad she needs a rescue, or at least as much. Since she has been on Klonopin for so long I am not going to start decreasing the dose yet, but she understands that I would like to get her down to at least 3 mg or lower a day, if that.  I recommend starting BuSpar.  Explained benefits, risks and possible side effects and she accepts.  Once the BuSpar is effective then we will start to wean the Klonopin by no more than 0.25 mg daily every 2 to 3 weeks.  She states she was not aware that Klonopin is a controlled substance and would like to start going off it now.  I advised against that, again because she has been on it so long and I do not want her to have withdrawals or rebound anxiety.  She  understands.  Blood pressure is up today.  She states it is usually normal and she is taking her medications.  She is just nervous.  She will check it at home and if it is staying above 140/90, she needs to contact the provider who is treating her for that.  Start BuSpar 15 mg 1/3 tablet twice daily for 1 week, then increase to 2/3 tablet twice daily for 1 week, then increase to 1 tablet twice daily for anxiety. Continue Klonopin 1 mg, 1 p.o. 4 times daily as needed. Recommend counseling. Return in 4 weeks.  Melony Overly, PA-C

## 2021-10-22 ENCOUNTER — Telehealth: Payer: Self-pay | Admitting: Physician Assistant

## 2021-10-22 NOTE — Telephone Encounter (Signed)
LVM to rtc 

## 2021-10-22 NOTE — Telephone Encounter (Signed)
Pt lvm at 4:10 pm and said that she is having all the side effects from the buspar. She said her BP is still high has nausea, chills. She needs to know what to do.[phone] [id number]

## 2021-10-25 NOTE — Telephone Encounter (Signed)
Still unable to reach pt

## 2021-11-10 ENCOUNTER — Other Ambulatory Visit: Payer: Self-pay | Admitting: Physician Assistant

## 2021-11-20 ENCOUNTER — Ambulatory Visit: Payer: Medicare PPO | Admitting: Physician Assistant

## 2021-12-11 ENCOUNTER — Telehealth: Payer: Self-pay | Admitting: Physician Assistant

## 2021-12-11 ENCOUNTER — Other Ambulatory Visit: Payer: Self-pay

## 2021-12-11 NOTE — Telephone Encounter (Signed)
Pended.

## 2021-12-11 NOTE — Telephone Encounter (Signed)
PT had to RS her appt 8/30 with [facility] to Appt 9/14. Please send in RF of Klonopin to :  [id number] - [place], [place] - [address]

## 2021-12-12 ENCOUNTER — Ambulatory Visit: Payer: Medicare PPO | Admitting: Physician Assistant

## 2021-12-12 MED ORDER — CLONAZEPAM 1 MG PO TABS: 1.0000 mg | ORAL_TABLET | Freq: Four times a day (QID) | ORAL | 0 refills | Status: DC | PRN

## 2021-12-14 ENCOUNTER — Telehealth: Payer: Self-pay | Admitting: Physician Assistant

## 2021-12-14 NOTE — Telephone Encounter (Signed)
Patient called regarding her Clonazepam prescription. It is at her pharmacy but she is unable to pick it up. They have told her that it is not covered. She needs someone to call pharmacy to see what is going on and why she is unable to pick it up. [phone] [facility] [address] [place] [phone]

## 2021-12-14 NOTE — Telephone Encounter (Signed)
Pharmacy stated it's ready to fill,pt informed

## 2021-12-27 ENCOUNTER — Encounter: Payer: Self-pay | Admitting: Physician Assistant

## 2021-12-27 ENCOUNTER — Telehealth (INDEPENDENT_AMBULATORY_CARE_PROVIDER_SITE_OTHER): Payer: Medicare PPO | Admitting: Physician Assistant

## 2021-12-27 DIAGNOSIS — F411 Generalized anxiety disorder: Secondary | ICD-10-CM | POA: Diagnosis not present

## 2021-12-27 DIAGNOSIS — Z79899 Other long term (current) drug therapy: Secondary | ICD-10-CM

## 2021-12-27 MED ORDER — BUSPIRONE HCL 15 MG PO TABS: 7.5000 mg | ORAL_TABLET | Freq: Two times a day (BID) | ORAL | 1 refills | Status: DC

## 2021-12-27 NOTE — Progress Notes (Signed)
[facility] Med Check  Patient ID: Margaret Dominguez,  MRN: [id number]  PCP: Avanell Shackleton, NP-C  Date of Evaluation: 12/27/2021 Time spent:20 minutes  Chief Complaint:  Chief Complaint   Anxiety; Follow-up     Virtual Visit via Telehealth  I connected with patient by a video enabled telemedicine application (she could see and hear me but I could not hear or see her so completion of appointment had to be by telephone.)  with their informed consent, and verified patient privacy and that I am speaking with the correct person using two identifiers.  I am private, in my office and the patient is at home.  I discussed the limitations, risks, security and privacy concerns of performing an evaluation and management service by video and the availability of in person appointments. I also discussed with the patient that there may be a patient responsible charge related to this service. The patient expressed understanding and agreed to proceed.   I discussed the assessment and treatment plan with the patient. The patient was provided an opportunity to ask questions and all were answered. The patient agreed with the plan and demonstrated an understanding of the instructions.   The patient was advised to call back or seek an in-person evaluation if the symptoms worsen or if the condition fails to improve as anticipated.  I provided 20  minutes of non-face-to-face time during this encounter.  HISTORY/CURRENT STATUS: HPI For routine med check.  She was started on BuSpar on 10/17/2021.  States she was unable to tolerate a higher dose than 7.5 mg once a day.  She has chronic migraines and said she was having a lot of headaches.  Not really certain whether the BuSpar was the cause or not though.  She is not able to tell me if the migraines were worse in severity or frequency since starting the BuSpar, she went off on a tangent about her pain medication, the surgery she had at 66 years old, and other  medical problems.  I finally was able to ascertain that the BuSpar has made "a little bit of a difference" and the severity of anxiety.  She is still taking Klonopin 1 mg 3-4 times per day, mostly for times it sounds like.  This is decreased from a total of 6 mg a day earlier this year.  See previous notes.  She has been on Klonopin since 1999, not at this dose though.  If she does not take it, she feels jittery and nervous.  She will have tachycardia as well.  She asked me specifically about the diagnosis of anxiety, states she was put on Klonopin for tachycardia, not anxiety and that diagnosis only came up several months ago.  No anhedonia.  Energy and motivation are good.  She is retired.  Sleeps well.  ADLs and personal hygiene are normal.  Appetite is normal and weight is stable.  Not isolating.  No changes in memory.  No suicidal or homicidal thoughts.  Patient denies increased energy with decreased need for sleep, increased talkativeness, racing thoughts, impulsivity or risky behaviors, increased spending, increased libido, grandiosity, increased irritability or anger, paranoia, or hallucinations.  Denies dizziness, syncope, seizures, numbness, tingling, tremor, tics, unsteady gait, slurred speech, confusion.  Has chronic migraines and back pain, sees Dr. Thyra Breed for pain management.  Individual Medical History/ Review of Systems: Changes? :No   Past Psychiatric History:    No psych hospitalizations. Never attempted suicide.    Past medications for mental health diagnoses  include: Wellbutrin caused tachycardia, Klonopin  Allergies: Dilaudid [hydromorphone], Guaifenesin, Levaquin [levofloxacin in d5w], Levofloxacin, Nsaids, and Prednisone  Current Medications:  Current Outpatient Medications:    Biotin 1 MG CAPS, Take by mouth daily., Disp: , Rfl:    calcium carbonate (CALCIUM 600) 600 MG TABS tablet, Take 600 mg by mouth 2 (two) times daily with a meal., Disp: , Rfl:    calcium  carbonate (TUMS - DOSED IN MG ELEMENTAL CALCIUM) 500 MG chewable tablet, Chew 1 tablet by mouth daily., Disp: , Rfl:    clonazePAM (KLONOPIN) 1 MG tablet, Take 1 tablet (1 mg total) by mouth 4 (four) times daily as needed for anxiety., Disp: 120 tablet, Rfl: 0   cromolyn (OPTICROM) 4 % ophthalmic solution, Place 1 drop into both eyes 4 (four) times daily., Disp: 10 mL, Rfl: 0   diclofenac sodium (VOLTAREN) 1 % GEL, Apply topically 4 (four) times daily., Disp: , Rfl:    fexofenadine (ALLEGRA) 180 MG tablet, Take 180 mg by mouth daily., Disp: , Rfl:    fluticasone (FLONASE) 50 MCG/ACT nasal spray, Place into both nostrils daily., Disp: , Rfl:    mometasone (ASMANEX) 220 MCG/INH inhaler, Inhale 2 puffs into the lungs daily., Disp: , Rfl:    montelukast (SINGULAIR) 10 MG tablet, Take 1 tablet (10 mg total) by mouth at bedtime., Disp: 30 tablet, Rfl: 5   tapentadol (NUCYNTA) 50 MG TABS tablet, Take 50 mg by mouth 2 (two) times daily., Disp: , Rfl:    tiZANidine (ZANAFLEX) 4 MG capsule, Take 4 mg by mouth 3 (three) times daily as needed for muscle spasms., Disp: , Rfl:    verapamil (VERELAN PM) 240 MG 24 hr capsule, Take 240 mg by mouth at bedtime., Disp: , Rfl:    vitamin E 400 UNIT capsule, Take 400 Units by mouth daily., Disp: , Rfl:    busPIRone (BUSPAR) 15 MG tablet, Take 0.5 tablets (7.5 mg total) by mouth 2 (two) times daily., Disp: 30 tablet, Rfl: 1 Medication Side Effects: none  Family Medical/ Social History: Changes? No  MENTAL HEALTH EXAM:  There were no vitals taken for this visit.There is no height or weight on file to calculate BMI.  General Appearance:  unable to assess  Eye Contact:   unable to assess  Speech:  Clear and Coherent and Normal Rate  Volume:  Normal  Mood:  Euthymic  Affect:   Unable to assess  Thought Process:  Disorganized and Descriptions of Associations: Tangential  Orientation:  Full (Time, Place, and Person)  Thought Content: Tangential   Suicidal Thoughts:   No  Homicidal Thoughts:  No  Memory:  WNL  Judgement:  Fair  Insight:  Fair  Psychomotor Activity:   Unable to assess  Concentration:  Concentration: Good  Recall:  Good  Fund of Knowledge: Good  Language: Good  Assets:  Desire for Improvement  ADL's:  Intact  Cognition: WNL  Prognosis:  Good   DIAGNOSES:    ICD-10-CM   1. Generalized anxiety disorder  F41.1     2. Chronic prescription benzodiazepine use  Z79.899      Receiving Psychotherapy: No   RECOMMENDATIONS:  PDMP reviewed.  Klonopin filled 12/14/2021.  On Nucynta, known to me. I provided 20 minutes of non-face-to-face time during this encounter, including time spent before and after the visit in records review, medical decision making, counseling pertinent to today's visit, and charting.   I explained to her about the diagnosis of anxiety, I think that has  caused the tachycardia, at least a little bit anyway, and I have to have a mental health diagnoses in order for her insurance to pay for this visit.  She verbalized understanding.  We discussed the BuSpar, I would like for her to increase to 7.5 mg twice a day if she can tolerate it.  It is hard to say if the medication caused a spike in the migraines or not.  It is difficult to obtain the history from her because she goes off topic easily.  The goal is to treat the anxiety with BuSpar to the point that she does not need as high of a dose of Klonopin, or even maybe 1 day not needed at all.  However any decrease in dose must be slow and I recommend we stay at the current Klonopin dose for now.  She understands and is willing to increase the BuSpar.  If the headaches worsen then she will go back to 7.5 mg once a day.  Increase BuSpar 15 mg to one half p.o. twice daily. Continue Klonopin 1 mg, 1 p.o. 4 times daily as needed. Continue vitamins as per med list. Recommend counseling. Return in 6 weeks.  Melony Overly, PA-C

## 2022-01-25 ENCOUNTER — Telehealth: Payer: Self-pay | Admitting: Physician Assistant

## 2022-01-25 ENCOUNTER — Other Ambulatory Visit: Payer: Self-pay

## 2022-01-25 MED ORDER — CLONAZEPAM 1 MG PO TABS: 1.0000 mg | ORAL_TABLET | Freq: Four times a day (QID) | ORAL | 0 refills | Status: DC | PRN

## 2022-01-25 NOTE — Telephone Encounter (Signed)
Pended.

## 2022-01-25 NOTE — Telephone Encounter (Signed)
Pt requesting RF for  Clonazepam. [facility] [address]

## 2022-02-15 ENCOUNTER — Telehealth: Payer: Medicare PPO | Admitting: Physician Assistant

## 2022-02-21 ENCOUNTER — Ambulatory Visit: Payer: Medicare PPO | Admitting: Physician Assistant

## 2022-03-06 ENCOUNTER — Telehealth: Payer: Self-pay | Admitting: Physician Assistant

## 2022-03-06 ENCOUNTER — Other Ambulatory Visit: Payer: Self-pay

## 2022-03-06 MED ORDER — CLONAZEPAM 1 MG PO TABS: 1.0000 mg | ORAL_TABLET | Freq: Four times a day (QID) | ORAL | 0 refills | Status: DC | PRN

## 2022-03-06 NOTE — Telephone Encounter (Signed)
Pended.

## 2022-03-06 NOTE — Telephone Encounter (Signed)
Next visit is 04/03/22. Requesting a refill on Clonazepam called to:  [id number] [place], [place] - [address]   Phone: [phone]  Fax: [phone]

## 2022-03-15 ENCOUNTER — Telehealth (INDEPENDENT_AMBULATORY_CARE_PROVIDER_SITE_OTHER): Payer: Medicare PPO | Admitting: Physician Assistant

## 2022-03-15 ENCOUNTER — Encounter: Payer: Self-pay | Admitting: Physician Assistant

## 2022-03-15 DIAGNOSIS — G894 Chronic pain syndrome: Secondary | ICD-10-CM

## 2022-03-15 DIAGNOSIS — F411 Generalized anxiety disorder: Secondary | ICD-10-CM | POA: Diagnosis not present

## 2022-03-15 DIAGNOSIS — Z79899 Other long term (current) drug therapy: Secondary | ICD-10-CM

## 2022-03-15 DIAGNOSIS — F330 Major depressive disorder, recurrent, mild: Secondary | ICD-10-CM

## 2022-03-15 MED ORDER — CLONAZEPAM 1 MG PO TABS: 1.0000 mg | ORAL_TABLET | Freq: Three times a day (TID) | ORAL | 0 refills | Status: DC | PRN

## 2022-03-15 NOTE — Progress Notes (Signed)
[facility]  Patient ID: Margaret Dominguez,  MRN: [id number]  PCP: Avanell Shackleton, NP-C  Date of Evaluation: 03/15/2022 Time spent:20 minutes  Chief Complaint:  Chief Complaint   Anxiety; Follow-up    Virtual Visit via Telehealth  I connected with patient by a video enabled telemedicine application with their informed consent, and verified patient privacy and that I am speaking with the correct person using two identifiers.  I am private, in my office and the patient is at home.  I discussed the limitations, risks, security and privacy concerns of performing an evaluation and management service by video and the availability of in person appointments. I also discussed with the patient that there may be a patient responsible charge related to this service. The patient expressed understanding and agreed to proceed.   I discussed the assessment and treatment plan with the patient. The patient was provided an opportunity to ask questions and all were answered. The patient agreed with the plan and demonstrated an understanding of the instructions.   The patient was advised to call back or seek an in-person evaluation if the symptoms worsen or if the condition fails to improve as anticipated.  I provided 20 minutes of non-face-to-face time during this encounter.   HISTORY/CURRENT STATUS: HPI For routine med check.  Still has anxiety, but controlled. Not taking Klonopin qid. Not needing it that often. No PA, just feels overwhelmed at times. Feels like she may get panicky if she doesn't take the klonopin. Is tolerating the Buspar well. No SE now.   Patient is able to enjoy things.  Energy and motivation are good.   No extreme sadness, tearfulness, or feelings of hopelessness.  Sleeps well most of the time. ADLs and personal hygiene are normal.   Denies any changes in concentration, making decisions, or remembering things.  Appetite has not changed.  Weight is stable.   Denies  suicidal or homicidal thoughts.  Patient denies increased energy with decreased need for sleep, increased talkativeness, racing thoughts, impulsivity or risky behaviors, increased spending, increased libido, grandiosity, increased irritability or anger, paranoia, or hallucinations.  Denies dizziness, syncope, seizures, numbness, tingling, tremor, tics, unsteady gait, slurred speech, confusion.  Has chronic migraines and back pain, sees Dr. Thyra Breed for pain management.  Individual Medical History/ Review of Systems: Changes? :No   Past Psychiatric History:    No psych hospitalizations. Never attempted suicide.    Past medications for mental health diagnoses include: Wellbutrin caused tachycardia, Klonopin  Allergies: Dilaudid [hydromorphone], Guaifenesin, Levaquin [levofloxacin in d5w], Levofloxacin, Nsaids, and Prednisone  Current Medications:  Current Outpatient Medications:    Biotin 1 MG CAPS, Take by mouth daily., Disp: , Rfl:    busPIRone (BUSPAR) 15 MG tablet, Take 0.5 tablets (7.5 mg total) by mouth 2 (two) times daily., Disp: 30 tablet, Rfl: 1   calcium carbonate (CALCIUM 600) 600 MG TABS tablet, Take 600 mg by mouth 2 (two) times daily with a meal., Disp: , Rfl:    calcium carbonate (TUMS - DOSED IN MG ELEMENTAL CALCIUM) 500 MG chewable tablet, Chew 1 tablet by mouth daily., Disp: , Rfl:    cromolyn (OPTICROM) 4 % ophthalmic solution, Place 1 drop into both eyes 4 (four) times daily., Disp: 10 mL, Rfl: 0   diclofenac sodium (VOLTAREN) 1 % GEL, Apply topically 4 (four) times daily., Disp: , Rfl:    fluticasone (FLONASE) 50 MCG/ACT nasal spray, Place into both nostrils daily., Disp: , Rfl:    montelukast (SINGULAIR) 10  MG tablet, Take 1 tablet (10 mg total) by mouth at bedtime., Disp: 30 tablet, Rfl: 5   tapentadol (NUCYNTA) 50 MG TABS tablet, Take 50 mg by mouth 2 (two) times daily., Disp: , Rfl:    tiZANidine (ZANAFLEX) 4 MG capsule, Take 4 mg by mouth 3 (three) times daily  as needed for muscle spasms., Disp: , Rfl:    verapamil (VERELAN PM) 240 MG 24 hr capsule, Take 240 mg by mouth at bedtime., Disp: , Rfl:    vitamin E 400 UNIT capsule, Take 400 Units by mouth daily., Disp: , Rfl:    clonazePAM (KLONOPIN) 1 MG tablet, Take 1 tablet (1 mg total) by mouth 3 (three) times daily as needed for anxiety., Disp: 90 tablet, Rfl: 0   fexofenadine (ALLEGRA) 180 MG tablet, Take 180 mg by mouth daily. (Patient not taking: Reported on 03/15/2022), Disp: , Rfl:    mometasone (ASMANEX) 220 MCG/INH inhaler, Inhale 2 puffs into the lungs daily. (Patient not taking: Reported on 03/15/2022), Disp: , Rfl:  Medication Side Effects: none  Family Medical/ Social History: Changes? No  MENTAL HEALTH EXAM:  There were no vitals taken for this visit.There is no height or weight on file to calculate BMI.  General Appearance: Casual and Well Groomed  Eye Contact:  Good  Speech:  Clear and Coherent and Normal Rate  Volume:  Normal  Mood:  Euthymic  Affect:  Congruent  Thought Process:  Goal Directed and Descriptions of Associations: Circumstantial  Orientation:  Full (Time, Place, and Person)  Thought Content: Tangential   Suicidal Thoughts:  No  Homicidal Thoughts:  No  Memory:  WNL  Judgement:  Fair  Insight:  Fair  Psychomotor Activity:  Normal  Concentration:  Concentration: Good  Recall:  Good  Fund of Knowledge: Good  Language: Good  Assets:  Desire for Improvement  ADL's:  Intact  Cognition: WNL  Prognosis:  Good   DIAGNOSES:    ICD-10-CM   1. Generalized anxiety disorder  F41.1     2. Chronic prescription benzodiazepine use  Z79.899     3. 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 Chronic pain syndrome  G89.4      Receiving Psychotherapy: Yes   Arrie Eastern  RECOMMENDATIONS:  PDMP reviewed.  Klonopin filled 03/06/2022.  I provided 20 minutes of non-face-to-face time during this encounter, including time spent before and after the visit in  records review, medical decision making, counseling pertinent to today's visit, and charting.   Since she is not needing as many Klonopin, I am decreasing the quantity to 90 pills/month. No other changes.   Continue  BuSpar 15 mg, 1/2  p.o. twice daily. Continue Klonopin 1 mg, 1 p.o. tid prn.  Continue vitamins as per med list. Continue counseling with Arrie Eastern.  Return in 3 mo.  Melony Overly, PA-C

## 2022-03-18 ENCOUNTER — Telehealth: Payer: Medicare PPO | Admitting: Physician Assistant

## 2022-04-03 ENCOUNTER — Telehealth: Payer: Medicare PPO | Admitting: Physician Assistant

## 2022-04-19 ENCOUNTER — Other Ambulatory Visit: Payer: Self-pay | Admitting: Physician Assistant

## 2022-04-19 NOTE — Telephone Encounter (Signed)
Pt also left voice mail requesting RF on Clonazepam

## 2022-04-22 ENCOUNTER — Telehealth: Payer: Self-pay | Admitting: Physician Assistant

## 2022-04-22 NOTE — Telephone Encounter (Signed)
Pt Lvm @ 4:46p.  She said she called last Friday.  I don't see an encounter.  I couldn't make out what she said except something about "worse symptoms".  Pls call her back at [phone] or (C) [phone]  Next appt 3/1

## 2022-04-23 NOTE — Telephone Encounter (Signed)
Spoke to pt and she stated she is fine.She let me know there was a death and the family that she was struggling to cope but now she is fine.She will call back if she needs anything.

## 2022-04-23 NOTE — Telephone Encounter (Signed)
LVM to rtc 

## 2022-05-30 ENCOUNTER — Telehealth: Payer: Self-pay | Admitting: Physician Assistant

## 2022-05-30 NOTE — Telephone Encounter (Signed)
Patient called in for refill on Clonazepam 49m. Ph: [id number]

## 2022-05-31 ENCOUNTER — Other Ambulatory Visit: Payer: Self-pay

## 2022-05-31 MED ORDER — CLONAZEPAM 1 MG PO TABS: ORAL_TABLET | ORAL | 0 refills | Status: DC

## 2022-05-31 NOTE — Telephone Encounter (Signed)
Pended.

## 2022-06-14 ENCOUNTER — Encounter: Payer: Self-pay | Admitting: Physician Assistant

## 2022-06-14 ENCOUNTER — Telehealth (INDEPENDENT_AMBULATORY_CARE_PROVIDER_SITE_OTHER): Payer: Medicare PPO | Admitting: Physician Assistant

## 2022-06-14 DIAGNOSIS — F330 Major depressive disorder, recurrent, mild: Secondary | ICD-10-CM

## 2022-06-14 DIAGNOSIS — F411 Generalized anxiety disorder: Secondary | ICD-10-CM

## 2022-06-14 DIAGNOSIS — G894 Chronic pain syndrome: Secondary | ICD-10-CM | POA: Diagnosis not present

## 2022-06-14 DIAGNOSIS — Z79899 Other long term (current) drug therapy: Secondary | ICD-10-CM | POA: Diagnosis not present

## 2022-06-14 MED ORDER — BUSPIRONE HCL 15 MG PO TABS: 7.5000 mg | ORAL_TABLET | Freq: Two times a day (BID) | ORAL | 1 refills | Status: DC

## 2022-06-14 NOTE — Progress Notes (Signed)
[facility]  Patient ID: Margaret Dominguez,  MRN: [id number]  PCP: Girtha Rm, NP-C  Date of Evaluation: 06/14/2022 Time spent:20 minutes  Chief Complaint:  Chief Complaint   Anxiety; Follow-up    Virtual Visit via Telehealth  I connected with patient by a video enabled telemedicine application with their informed consent, and verified patient privacy and that I am speaking with the correct person using two identifiers.  I am private, in my office and the patient is at home.  I discussed the limitations, risks, security and privacy concerns of performing an evaluation and management service by video and the availability of in person appointments. I also discussed with the patient that there may be a patient responsible charge related to this service. The patient expressed understanding and agreed to proceed.   I discussed the assessment and treatment plan with the patient. The patient was provided an opportunity to ask questions and all were answered. The patient agreed with the plan and demonstrated an understanding of the instructions.   The patient was advised to call back or seek an in-person evaluation if the symptoms worsen or if the condition fails to improve as anticipated.  I provided 20 minutes of non-face-to-face time during this encounter.   HISTORY/CURRENT STATUS: HPI For routine med check.  Feels that anxiety is well controlled with the Buspar and Klonopin. Not having PA, but still gets overwhelmed easily and needs the Klonopin. Doesn't take four times a day, so a month's supply lasts about 6 weeks now. She's particularly anxious about a couple of upcoming dr appts. She may need another Cspine surgery, not sure.   Energy and motivation are good most of the time. Sometimes she doesn't want to get out of bed but doesn't feel like it's from depression.  No extreme sadness, tearfulness, or feelings of hopelessness.  Sleeps well.  ADLs and personal hygiene are  normal.   Denies any changes in concentration, making decisions, or remembering things.  Appetite has not changed.  Weight is stable.   Denies suicidal or homicidal thoughts.  Patient denies increased energy with decreased need for sleep, increased talkativeness, racing thoughts, impulsivity or risky behaviors, increased spending, increased libido, grandiosity, increased irritability or anger, paranoia, or hallucinations.  Denies dizziness, syncope, seizures, numbness, tingling, tremor, tics, unsteady gait, slurred speech, confusion.  Has chronic migraines and back pain, sees Dr. Nicholaus Bloom for pain management.  Individual Medical History/ Review of Systems: Changes? :No   Past Psychiatric History:    No psych hospitalizations. Never attempted suicide.    Past medications for mental health diagnoses include: Wellbutrin caused tachycardia, Klonopin  Allergies: Dilaudid [hydromorphone], Guaifenesin, Levaquin [levofloxacin in d5w], Levofloxacin, Nsaids, and Prednisone  Current Medications:  Current Outpatient Medications:    Biotin 1 MG CAPS, Take by mouth daily., Disp: , Rfl:    calcium carbonate (CALCIUM 600) 600 MG TABS tablet, Take 600 mg by mouth 2 (two) times daily with a meal., Disp: , Rfl:    calcium carbonate (TUMS - DOSED IN MG ELEMENTAL CALCIUM) 500 MG chewable tablet, Chew 1 tablet by mouth daily., Disp: , Rfl:    clonazePAM (KLONOPIN) 1 MG tablet, TAKE 1 TABLET (1 MG TOTAL) BY MOUTH 4 TIMES A DAY AS NEEDED FOR ANXIETY, Disp: 120 tablet, Rfl: 0   cromolyn (OPTICROM) 4 % ophthalmic solution, Place 1 drop into both eyes 4 (four) times daily., Disp: 10 mL, Rfl: 0   diclofenac sodium (VOLTAREN) 1 % GEL, Apply topically 4 (four)  times daily., Disp: , Rfl:    fluticasone (FLONASE) 50 MCG/ACT nasal spray, Place into both nostrils daily., Disp: , Rfl:    tapentadol (NUCYNTA) 50 MG TABS tablet, Take 50 mg by mouth 2 (two) times daily., Disp: , Rfl:    tiZANidine (ZANAFLEX) 4 MG capsule,  Take 4 mg by mouth 3 (three) times daily as needed for muscle spasms., Disp: , Rfl:    verapamil (VERELAN PM) 240 MG 24 hr capsule, Take 240 mg by mouth at bedtime., Disp: , Rfl:    vitamin E 400 UNIT capsule, Take 400 Units by mouth daily., Disp: , Rfl:    busPIRone (BUSPAR) 15 MG tablet, Take 0.5 tablets (7.5 mg total) by mouth 2 (two) times daily., Disp: 45 tablet, Rfl: 1   fexofenadine (ALLEGRA) 180 MG tablet, Take 180 mg by mouth daily. (Patient not taking: Reported on 03/15/2022), Disp: , Rfl:    mometasone (ASMANEX) 220 MCG/INH inhaler, Inhale 2 puffs into the lungs daily. (Patient not taking: Reported on 03/15/2022), Disp: , Rfl:    montelukast (SINGULAIR) 10 MG tablet, Take 1 tablet (10 mg total) by mouth at bedtime. (Patient not taking: Reported on 06/14/2022), Disp: 30 tablet, Rfl: 5 Medication Side Effects: none  Family Medical/ Social History: Changes? No  MENTAL HEALTH EXAM:  There were no vitals taken for this visit.There is no height or weight on file to calculate BMI.  General Appearance: Casual and Well Groomed  Eye Contact:  Good  Speech:  Clear and Coherent and Normal Rate  Volume:  Normal  Mood:  Euthymic  Affect:  Congruent  Thought Process:  Goal Directed and Descriptions of Associations: Circumstantial  Orientation:  Full (Time, Place, and Person)  Thought Content: Tangential   Suicidal Thoughts:  No  Homicidal Thoughts:  No  Memory:  WNL  Judgement:  Fair  Insight:  Fair  Psychomotor Activity:  Normal  Concentration:  Concentration: Good  Recall:  Good  Fund of Knowledge: Good  Language: Good  Assets:  Desire for Improvement Financial Resources/Insurance Housing Transportation  ADL's:  Intact  Cognition: WNL  Prognosis:  Good   DIAGNOSES:    ICD-10-CM   1. Generalized anxiety disorder  F41.1     2. Mild recurrent major depression ([place])  F33.0     3. Chronic prescription benzodiazepine use  Z79.899     4. Chronic pain syndrome  G89.4        Receiving Psychotherapy: Yes   Alphonzo Dublin  RECOMMENDATIONS:  PDMP reviewed.  Klonopin filled 05/31/2022. Gean Birchwood known to me.  I provided 20 minutes of non-face-to-face time during this encounter, including time spent before and after the visit in records review, medical decision making, counseling pertinent to today's visit, and charting.   She's doing well on current treatment so no change needed.  Continue  BuSpar 15 mg, 1/2  p.o. twice daily. Continue Klonopin 1 mg, 1 p.o. tid prn.  Continue vitamins as per med list. Continue counseling with Alphonzo Dublin.  Return in 3 mo.  Donnal Moat, PA-C

## 2022-07-11 ENCOUNTER — Telehealth: Payer: Self-pay | Admitting: Physician Assistant

## 2022-07-11 ENCOUNTER — Other Ambulatory Visit: Payer: Self-pay | Admitting: Physician Assistant

## 2022-07-11 MED ORDER — CLONAZEPAM 1 MG PO TABS: ORAL_TABLET | ORAL | 1 refills | Status: DC

## 2022-07-11 NOTE — Telephone Encounter (Signed)
Pt requesting Rx for Clonazepam  [facility] [place] [address] [facility]

## 2022-07-11 NOTE — Telephone Encounter (Signed)
Prescription sent

## 2022-08-23 ENCOUNTER — Telehealth: Payer: Self-pay | Admitting: Physician Assistant

## 2022-08-23 ENCOUNTER — Other Ambulatory Visit: Payer: Self-pay | Admitting: Physician Assistant

## 2022-08-23 MED ORDER — CLONAZEPAM 1 MG PO TABS: ORAL_TABLET | ORAL | 1 refills | Status: DC

## 2022-08-23 NOTE — Telephone Encounter (Signed)
sent 

## 2022-08-23 NOTE — Telephone Encounter (Signed)
Pt called and said  that she needs a refill on her klonopin. She wants [facility] the [facility] [address]

## 2022-09-19 ENCOUNTER — Other Ambulatory Visit: Payer: Self-pay | Admitting: Internal Medicine

## 2022-09-19 DIAGNOSIS — Z1231 Encounter for screening mammogram for malignant neoplasm of breast: Secondary | ICD-10-CM

## 2022-09-23 ENCOUNTER — Encounter: Payer: Self-pay | Admitting: Physician Assistant

## 2022-09-23 ENCOUNTER — Telehealth (INDEPENDENT_AMBULATORY_CARE_PROVIDER_SITE_OTHER): Payer: Medicare PPO | Admitting: Physician Assistant

## 2022-09-23 DIAGNOSIS — F411 Generalized anxiety disorder: Secondary | ICD-10-CM | POA: Diagnosis not present

## 2022-09-23 DIAGNOSIS — F1320 Sedative, hypnotic or anxiolytic dependence, uncomplicated: Secondary | ICD-10-CM

## 2022-09-23 MED ORDER — BUSPIRONE HCL 15 MG PO TABS: 7.5000 mg | ORAL_TABLET | Freq: Two times a day (BID) | ORAL | 1 refills | Status: DC

## 2022-09-23 NOTE — Progress Notes (Signed)
[facility]  Patient ID: Margaret Dominguez,  MRN: [id number]  PCP: Margaret Shackleton, NP-C  Date of Evaluation: 09/23/2022 Time spent:20 minutes  Chief Complaint:  Chief Complaint   Anxiety; Follow-up    Virtual Visit via Telehealth  I connected with patient by a video enabled telemedicine application with their informed consent, and verified patient privacy and that I am speaking with the correct person using two identifiers.  I am private, in my office and the patient is at home.  I discussed the limitations, risks, security and privacy concerns of performing an evaluation and management service by video and the availability of in person appointments. I also discussed with the patient that there may be a patient responsible charge related to this service. The patient expressed understanding and agreed to proceed.   I discussed the assessment and treatment plan with the patient. The patient was provided an opportunity to ask questions and all were answered. The patient agreed with the plan and demonstrated an understanding of the instructions.   The patient was advised to call back or seek an in-person evaluation if the symptoms worsen or if the condition fails to improve as anticipated.  I provided 20 minutes of non-face-to-face time during this encounter.   HISTORY/CURRENT STATUS: HPI For routine med check.  Feels like meds are working well. She gets overwhelmed easily, and needs the Klonopin, states she doesn't take it qid as it's written, not having PA but feels that if she didn't have the Klonopin to rescue her sometimes she'd have a PA. She's nervous about an upcoming Cspine surgery, not sure when it'll be though.   Patient is able to enjoy things.  Energy and motivation are fair to good depending on the day.   No extreme sadness, tearfulness, or feelings of hopelessness.  Sleeps well most of the time. ADLs and personal hygiene are normal.   Denies any changes in  concentration, making decisions, or remembering things.  Weight is stable.  She's drinking Boost a lot b/c she doesn't have much of an appetite some days.  Denies suicidal or homicidal thoughts.  Patient denies increased energy with decreased need for sleep, increased talkativeness, racing thoughts, impulsivity or risky behaviors, increased spending, increased libido, grandiosity, increased irritability or anger, paranoia, or hallucinations.  Denies dizziness, syncope, seizures, numbness, tingling, tremor, tics, unsteady gait, slurred speech, confusion.  Has chronic migraines and back pain, sees Dr. Thyra Breed for pain management.  Individual Medical History/ Review of Systems: Changes? :No   Past Psychiatric History:    No psych hospitalizations. Never attempted suicide.    Past medications for mental health diagnoses include: Wellbutrin caused tachycardia, Klonopin  Allergies: Dilaudid [hydromorphone], Guaifenesin, Levaquin [levofloxacin in d5w], Levofloxacin, Nsaids, and Prednisone  Current Medications:  Current Outpatient Medications:    Biotin 1 MG CAPS, Take by mouth daily., Disp: , Rfl:    clonazePAM (KLONOPIN) 1 MG tablet, TAKE 1 TABLET (1 MG TOTAL) BY MOUTH 4 TIMES A DAY AS NEEDED FOR ANXIETY, Disp: 120 tablet, Rfl: 1   cromolyn (OPTICROM) 4 % ophthalmic solution, Place 1 drop into both eyes 4 (four) times daily., Disp: 10 mL, Rfl: 0   diclofenac sodium (VOLTAREN) 1 % GEL, Apply topically 4 (four) times daily., Disp: , Rfl:    fluticasone (FLONASE) 50 MCG/ACT nasal spray, Place into both nostrils daily., Disp: , Rfl:    tapentadol (NUCYNTA) 50 MG TABS tablet, Take 50 mg by mouth 2 (two) times daily., Disp: , Rfl:  tiZANidine (ZANAFLEX) 4 MG capsule, Take 4 mg by mouth 3 (three) times daily as needed for muscle spasms., Disp: , Rfl:    verapamil (VERELAN PM) 240 MG 24 hr capsule, Take 240 mg by mouth at bedtime., Disp: , Rfl:    vitamin E 400 UNIT capsule, Take 400 Units by  mouth daily., Disp: , Rfl:    busPIRone (BUSPAR) 15 MG tablet, Take 0.5 tablets (7.5 mg total) by mouth 2 (two) times daily., Disp: 90 tablet, Rfl: 1   calcium carbonate (CALCIUM 600) 600 MG TABS tablet, Take 600 mg by mouth 2 (two) times daily with a meal. (Patient not taking: Reported on 09/23/2022), Disp: , Rfl:    calcium carbonate (TUMS - DOSED IN MG ELEMENTAL CALCIUM) 500 MG chewable tablet, Chew 1 tablet by mouth daily. (Patient not taking: Reported on 09/23/2022), Disp: , Rfl:    fexofenadine (ALLEGRA) 180 MG tablet, Take 180 mg by mouth daily. (Patient not taking: Reported on 03/15/2022), Disp: , Rfl:    mometasone (ASMANEX) 220 MCG/INH inhaler, Inhale 2 puffs into the lungs daily. (Patient not taking: Reported on 03/15/2022), Disp: , Rfl:    montelukast (SINGULAIR) 10 MG tablet, Take 1 tablet (10 mg total) by mouth at bedtime. (Patient not taking: Reported on 06/14/2022), Disp: 30 tablet, Rfl: 5 Medication Side Effects: none  Family Medical/ Social History: Changes? No  MENTAL HEALTH EXAM:  There were no vitals taken for this visit.There is no height or weight on file to calculate BMI.  General Appearance: Casual and Well Groomed  Eye Contact:  Good  Speech:  Clear and Coherent and Normal Rate  Volume:  Normal  Mood:  Euthymic  Affect:  Congruent  Thought Process:  Goal Directed and Descriptions of Associations: Circumstantial  Orientation:  Full (Time, Place, and Person)  Thought Content: Tangential   Suicidal Thoughts:  No  Homicidal Thoughts:  No  Memory:  WNL  Judgement:  Good  Insight:  Fair  Psychomotor Activity:  Normal  Concentration:  Concentration: Good  Recall:  Good  Fund of Knowledge: Good  Language: Good  Assets:  Desire for Improvement Financial Resources/Insurance Housing Transportation  ADL's:  Intact  Cognition: WNL  Prognosis:  Good   DIAGNOSES:    ICD-10-CM   1. Generalized anxiety disorder  F41.1     2. Benzodiazepine dependence (HCC)  F13.20       Receiving Psychotherapy: Yes   Arrie Eastern  RECOMMENDATIONS:  PDMP reviewed.  Klonopin filled 08/23/2022.  Nucynta known to me.  I provided 20 minutes of non-face-to-face time during this encounter, including time spent before and after the visit in records review, medical decision making, counseling pertinent to today's visit, and charting.   She's doing well overall. We again discussed the Klonopin and its possible SE, addictive potential, tolerance, confusion, imbalance, falls. She's been on it for 25 years or so and doesn't feel like she can decrease the dose now. She was on 6 mg/day over a year ago, so we've made some progress in decreasing it. Reminded her to take as little as possible. She was unable to tolerate a higher dose of Buspar d/t H/A.  Continue  BuSpar 15 mg, 1/2  p.o. twice daily. Continue Klonopin 1 mg, 1 p.o. qid prn.  Continue counseling with Arrie Eastern.  Return in 6 mo.  Melony Overly, PA-C

## 2022-09-25 ENCOUNTER — Other Ambulatory Visit: Payer: Self-pay | Admitting: Internal Medicine

## 2022-09-25 DIAGNOSIS — M810 Age-related osteoporosis without current pathological fracture: Secondary | ICD-10-CM

## 2022-11-15 ENCOUNTER — Other Ambulatory Visit: Payer: Self-pay

## 2022-11-15 ENCOUNTER — Telehealth: Payer: Self-pay | Admitting: Physician Assistant

## 2022-11-15 MED ORDER — CLONAZEPAM 1 MG PO TABS: ORAL_TABLET | ORAL | 3 refills | Status: DC

## 2022-11-15 NOTE — Telephone Encounter (Signed)
Pended.

## 2022-11-15 NOTE — Telephone Encounter (Signed)
Patient called in for refill on Clonazepam 1mg . Ph: [phone] Appt 12/10 Pharmacy [facility] [address] Margaret Dominguez

## 2023-03-25 ENCOUNTER — Telehealth: Payer: Medicare PPO | Admitting: Physician Assistant

## 2023-03-25 ENCOUNTER — Encounter: Payer: Self-pay | Admitting: Physician Assistant

## 2023-03-25 DIAGNOSIS — Z79899 Other long term (current) drug therapy: Secondary | ICD-10-CM | POA: Diagnosis not present

## 2023-03-25 DIAGNOSIS — F411 Generalized anxiety disorder: Secondary | ICD-10-CM

## 2023-03-25 MED ORDER — BUSPIRONE HCL 15 MG PO TABS: 15.0000 mg | ORAL_TABLET | Freq: Two times a day (BID) | ORAL | 1 refills | Status: DC

## 2023-03-25 MED ORDER — CLONAZEPAM 1 MG PO TABS: ORAL_TABLET | ORAL | 5 refills | Status: DC

## 2023-03-25 NOTE — Progress Notes (Signed)
[facility]  Patient ID: Margaret Dominguez,  MRN: [id number]  PCP: Avanell Shackleton, NP-C  Date of Evaluation: 03/25/2023 Time spent:20 minutes  Chief Complaint:  Chief Complaint   Anxiety; Follow-up    Virtual Visit via Telehealth  I connected with patient by a video enabled telemedicine application with their informed consent, and verified patient privacy and that I am speaking with the correct person using two identifiers.  I am private, in my office and the patient is at home.  I discussed the limitations, risks, security and privacy concerns of performing an evaluation and management service by video and the availability of in person appointments. I also discussed with the patient that there may be a patient responsible charge related to this service. The patient expressed understanding and agreed to proceed.   I discussed the assessment and treatment plan with the patient. The patient was provided an opportunity to ask questions and all were answered. The patient agreed with the plan and demonstrated an understanding of the instructions.   The patient was advised to call back or seek an in-person evaluation if the symptoms worsen or if the condition fails to improve as anticipated.  I provided 20 minutes of non-face-to-face time during this encounter.   HISTORY/CURRENT STATUS: HPI For routine med check.  Feels like meds are working.  She increased the BuSpar to 15 mg twice daily because she felt more generalized anxiety.  It has helped.  She still takes the Klonopin 4 times a day because if she does not take it she gets panicky.  She has palpitations occasionally but they only last for a few seconds.  No shortness of breath or sweats.  She has been on the Klonopin for years.  Attempts to decrease the dose have failed.  Patient is able to enjoy things.  Energy and motivation are good.  No extreme sadness, tearfulness, or feelings of hopelessness.  Sleeps well most of  the time.  Klonopin is helpful for that.  ADLs and personal hygiene are normal.   Denies any changes in concentration, making decisions, or remembering things.  Appetite has not changed.  Weight is stable.  Denies suicidal or homicidal thoughts.  Patient denies increased energy with decreased need for sleep, increased talkativeness, racing thoughts, impulsivity or risky behaviors, increased spending, increased libido, grandiosity, increased irritability or anger, paranoia, or hallucinations.  Denies dizziness, syncope, seizures, numbness, tingling, tremor, tics, unsteady gait, slurred speech, confusion.  Has chronic migraines and back pain, sees Dr. Thyra Breed for pain management.  Individual Medical History/ Review of Systems: Changes? :No   Past Psychiatric History:    No psych hospitalizations. Never attempted suicide.    Past medications for mental health diagnoses include: Wellbutrin caused tachycardia, Klonopin  Allergies: Dilaudid [hydromorphone], Guaifenesin, Levaquin [levofloxacin in d5w], Levofloxacin, Nsaids, and Prednisone  Current Medications:  Current Outpatient Medications:    Biotin 1 MG CAPS, Take by mouth daily., Disp: , Rfl:    cromolyn (OPTICROM) 4 % ophthalmic solution, Place 1 drop into both eyes 4 (four) times daily., Disp: 10 mL, Rfl: 0   diclofenac sodium (VOLTAREN) 1 % GEL, Apply topically 4 (four) times daily., Disp: , Rfl:    fluticasone (FLONASE) 50 MCG/ACT nasal spray, Place into both nostrils daily., Disp: , Rfl:    tapentadol (NUCYNTA) 50 MG TABS tablet, Take 50 mg by mouth 2 (two) times daily., Disp: , Rfl:    tiZANidine (ZANAFLEX) 4 MG capsule, Take 4 mg by mouth 3 (three)  times daily as needed for muscle spasms., Disp: , Rfl:    verapamil (VERELAN PM) 240 MG 24 hr capsule, Take 240 mg by mouth at bedtime., Disp: , Rfl:    vitamin E 400 UNIT capsule, Take 400 Units by mouth daily., Disp: , Rfl:    busPIRone (BUSPAR) 15 MG tablet, Take 1 tablet (15 mg  total) by mouth 2 (two) times daily., Disp: 180 tablet, Rfl: 1   calcium carbonate (CALCIUM 600) 600 MG TABS tablet, Take 600 mg by mouth 2 (two) times daily with a meal. (Patient not taking: Reported on 09/23/2022), Disp: , Rfl:    calcium carbonate (TUMS - DOSED IN MG ELEMENTAL CALCIUM) 500 MG chewable tablet, Chew 1 tablet by mouth daily. (Patient not taking: Reported on 09/23/2022), Disp: , Rfl:    clonazePAM (KLONOPIN) 1 MG tablet, TAKE 1 TABLET (1 MG TOTAL) BY MOUTH 4 TIMES A DAY AS NEEDED FOR ANXIETY, Disp: 120 tablet, Rfl: 5   fexofenadine (ALLEGRA) 180 MG tablet, Take 180 mg by mouth daily. (Patient not taking: Reported on 03/15/2022), Disp: , Rfl:    mometasone (ASMANEX) 220 MCG/INH inhaler, Inhale 2 puffs into the lungs daily. (Patient not taking: Reported on 03/15/2022), Disp: , Rfl:    montelukast (SINGULAIR) 10 MG tablet, Take 1 tablet (10 mg total) by mouth at bedtime. (Patient not taking: Reported on 06/14/2022), Disp: 30 tablet, Rfl: 5 Medication Side Effects: none  Family Medical/ Social History: Changes? No  MENTAL HEALTH EXAM:  There were no vitals taken for this visit.There is no height or weight on file to calculate BMI.  General Appearance: Casual and Well Groomed  Eye Contact:  Good  Speech:  Clear and Coherent and Normal Rate  Volume:  Normal  Mood:  Euthymic  Affect:  Congruent  Thought Process:  Goal Directed and Descriptions of Associations: Circumstantial  Orientation:  Full (Time, Place, and Person)  Thought Content: Tangential   Suicidal Thoughts:  No  Homicidal Thoughts:  No  Memory:  WNL  Judgement:  Good  Insight:  Fair  Psychomotor Activity:  Normal  Concentration:  Concentration: Good  Recall:  Good  Fund of Knowledge: Good  Language: Good  Assets:  Desire for Improvement Financial Resources/Insurance Housing Resilience Transportation  ADL's:  Intact  Cognition: WNL  Prognosis:  Good   DIAGNOSES:    ICD-10-CM   1. Generalized anxiety disorder   F41.1     2. Chronic prescription benzodiazepine use  Z79.899       Receiving Psychotherapy: Yes   Arrie Eastern  RECOMMENDATIONS:  PDMP reviewed.  Klonopin 02/13/2023.  Nucynta known to me.  I provided 20 minutes of non-face to face time during this encounter, including time spent before and after the visit in records review, medical decision making, counseling pertinent to today's visit, and charting.   She is doing well on the current treatment so no changes will be made.  Continue  BuSpar 15 mg, 1 p.o. twice daily.   Continue Klonopin 1 mg, 1 p.o. qid prn.  Continue counseling with Arrie Eastern.  Return in 6 mo.  Melony Overly, PA-C

## 2023-05-07 ENCOUNTER — Other Ambulatory Visit: Payer: Self-pay | Admitting: Physician Assistant

## 2023-05-07 NOTE — Telephone Encounter (Signed)
Lf 12/12

## 2023-10-28 DIAGNOSIS — G894 Chronic pain syndrome: Secondary | ICD-10-CM | POA: Diagnosis not present

## 2023-10-28 DIAGNOSIS — M961 Postlaminectomy syndrome, not elsewhere classified: Secondary | ICD-10-CM | POA: Diagnosis not present

## 2023-10-28 DIAGNOSIS — M47812 Spondylosis without myelopathy or radiculopathy, cervical region: Secondary | ICD-10-CM | POA: Diagnosis not present

## 2023-10-28 DIAGNOSIS — Z79891 Long term (current) use of opiate analgesic: Secondary | ICD-10-CM | POA: Diagnosis not present

## 2023-11-03 ENCOUNTER — Telehealth: Payer: Self-pay | Admitting: Physician Assistant

## 2023-11-03 ENCOUNTER — Other Ambulatory Visit: Payer: Self-pay | Admitting: Physician Assistant

## 2023-11-03 NOTE — Telephone Encounter (Signed)
 Pt requesting RF on Lorazepam until apt 7/31 in office. [address]

## 2023-11-03 NOTE — Telephone Encounter (Signed)
 Addressed thru pharmacy interface.

## 2023-11-03 NOTE — Telephone Encounter (Signed)
 Pt is not on lorazepam, needs Rx for clonazepam . She had RF available but they expired. Last filled 6/1.

## 2023-11-13 ENCOUNTER — Ambulatory Visit: Admitting: Physician Assistant

## 2023-11-13 ENCOUNTER — Encounter: Payer: Self-pay | Admitting: Physician Assistant

## 2023-11-13 DIAGNOSIS — F411 Generalized anxiety disorder: Secondary | ICD-10-CM

## 2023-11-13 DIAGNOSIS — G894 Chronic pain syndrome: Secondary | ICD-10-CM | POA: Diagnosis not present

## 2023-11-13 DIAGNOSIS — Z79899 Other long term (current) drug therapy: Secondary | ICD-10-CM

## 2023-11-13 MED ORDER — CLONAZEPAM 1 MG PO TABS: ORAL_TABLET | ORAL | 5 refills | Status: AC

## 2023-11-13 MED ORDER — BUSPIRONE HCL 15 MG PO TABS: 15.0000 mg | ORAL_TABLET | Freq: Two times a day (BID) | ORAL | 1 refills | Status: AC

## 2023-11-13 NOTE — Progress Notes (Signed)
 [facility]  Patient ID: Margaret Dominguez,  MRN: [id number]  PCP: Lendia Boby CROME, NP-C  Date of Evaluation: 11/13/2023 Time spent:20 minutes  Chief Complaint:  Chief Complaint   Anxiety; Follow-up    HISTORY/CURRENT STATUS: HPI For routine med check.  Doing well w/ current meds.  No PA but does get overwhelmed on a daily basis.  Addition of BuSpar  has been helpful in preventing some of the anxiety.  She has been on Klonopin  for years and feels that it is still helpful.  If she does not take it there are times when the anxiety is so bad that she will have a panic attack.  Energy and motivation are fair to good depending on the day and her pain level.  No extreme sadness, tearfulness, or feelings of hopelessness.  Sleeps ok.  ADLs and personal hygiene are normal.   No reports of memory changes.  Appetite has not changed.  Weight is stable.  No mania, delirium, AH/VH.  No SI/HI.  Denies dizziness, syncope, seizures, numbness, tingling, tremor, tics, unsteady gait, slurred speech, confusion.  Has chronic migraines and back pain, sees Dr. Oneil Ellen for pain management.  Individual Medical History/ Review of Systems: Changes? :No   Past Psychiatric History:    No psych hospitalizations. Never attempted suicide.    Past medications for mental health diagnoses include: Wellbutrin caused tachycardia, Klonopin   Allergies: Dilaudid [hydromorphone], Guaifenesin, Levaquin [levofloxacin in d5w], Levofloxacin, Nsaids, and Prednisone  Current Medications:  Current Outpatient Medications:    calcium carbonate (CALCIUM 600) 600 MG TABS tablet, Take 600 mg by mouth 2 (two) times daily with a meal., Disp: , Rfl:    calcium carbonate (TUMS - DOSED IN MG ELEMENTAL CALCIUM) 500 MG chewable tablet, Chew 1 tablet by mouth daily., Disp: , Rfl:    cromolyn  (OPTICROM ) 4 % ophthalmic solution, Place 1 drop into both eyes 4 (four) times daily., Disp: 10 mL, Rfl: 0   diclofenac sodium  (VOLTAREN) 1 % GEL, Apply topically 4 (four) times daily., Disp: , Rfl:    fexofenadine (ALLEGRA) 180 MG tablet, Take 180 mg by mouth daily., Disp: , Rfl:    fluticasone (FLONASE) 50 MCG/ACT nasal spray, Place into both nostrils daily., Disp: , Rfl:    montelukast  (SINGULAIR ) 10 MG tablet, Take 1 tablet (10 mg total) by mouth at bedtime., Disp: 30 tablet, Rfl: 5   tapentadol (NUCYNTA) 50 MG TABS tablet, Take 50 mg by mouth 2 (two) times daily., Disp: , Rfl:    tiZANidine (ZANAFLEX) 4 MG capsule, Take 4 mg by mouth 3 (three) times daily as needed for muscle spasms., Disp: , Rfl:    verapamil (VERELAN PM) 240 MG 24 hr capsule, Take 240 mg by mouth at bedtime., Disp: , Rfl:    vitamin E 400 UNIT capsule, Take 400 Units by mouth daily., Disp: , Rfl:    Biotin 1 MG CAPS, Take by mouth daily., Disp: , Rfl:    busPIRone  (BUSPAR ) 15 MG tablet, Take 1 tablet (15 mg total) by mouth 2 (two) times daily., Disp: 180 tablet, Rfl: 1   clonazePAM  (KLONOPIN ) 1 MG tablet, TAKE 1 TABLET BY MOUTH 4 TIMES DAILY AS NEEDED FOR ANXIETY, Disp: 120 tablet, Rfl: 5   mometasone (ASMANEX) 220 MCG/INH inhaler, Inhale 2 puffs into the lungs daily. (Patient not taking: Reported on 03/15/2022), Disp: , Rfl:  Medication Side Effects: none  Family Medical/ Social History: Changes? No  MENTAL HEALTH EXAM:  There were no vitals taken for  this visit.There is no height or weight on file to calculate BMI.  General Appearance: Casual and Well Groomed  Eye Contact:  Good  Speech:  Clear and Coherent and Normal Rate  Volume:  Normal  Mood:  Euthymic  Affect:  Congruent  Thought Process:  Goal Directed and Descriptions of Associations: Circumstantial  Orientation:  Full (Time, Place, and Person)  Thought Content: Tangential   Suicidal Thoughts:  No  Homicidal Thoughts:  No  Memory:  WNL  Judgement:  Good  Insight:  Fair  Psychomotor Activity:  Normal  Concentration:  Concentration: Good  Recall:  Good  Fund of Knowledge:  Good  Language: Good  Assets:  Desire for Improvement Financial Resources/Insurance Housing Transportation  ADL's:  Intact  Cognition: WNL  Prognosis:  Good   DIAGNOSES:    ICD-10-CM   1. Generalized anxiety disorder  F41.1     2. Chronic prescription benzodiazepine use  Z79.899     3. Chronic pain syndrome  G89.4       Receiving Psychotherapy: Yes   Boby Bohr  RECOMMENDATIONS:  PDMP reviewed.  Klonopin  11/03/2023.  Nucynta known to me.  I provided approximately  20 minutes of face to face time during this encounter, including time spent before and after the visit in records review, medical decision making, counseling pertinent to today's visit, and charting.   We again discussed the benzodiazepines and the fact we try to avoid them in the elderly.  However she has been on Klonopin  for years, it is not causing falls or confusion and it is still effective so I will continue to prescribe it.  She will take it as little as she can get by with.  Reminded her that the goal of the BuSpar  is to help prevent the anxiety from being as intense.  She understands and agrees with this plan.  Continue  BuSpar  15 mg, 1 p.o. twice daily.   Continue Klonopin  1 mg, 1 p.o. qid prn.  Continue counseling with Boby Bohr.  Return in 6 mo.  Verneita Cooks, PA-C

## 2024-01-07 DIAGNOSIS — Z79891 Long term (current) use of opiate analgesic: Secondary | ICD-10-CM | POA: Diagnosis not present

## 2024-01-07 DIAGNOSIS — M961 Postlaminectomy syndrome, not elsewhere classified: Secondary | ICD-10-CM | POA: Diagnosis not present

## 2024-01-07 DIAGNOSIS — M47812 Spondylosis without myelopathy or radiculopathy, cervical region: Secondary | ICD-10-CM | POA: Diagnosis not present

## 2024-01-07 DIAGNOSIS — G894 Chronic pain syndrome: Secondary | ICD-10-CM | POA: Diagnosis not present

## 2024-03-03 DIAGNOSIS — G894 Chronic pain syndrome: Secondary | ICD-10-CM | POA: Diagnosis not present

## 2024-03-03 DIAGNOSIS — M47812 Spondylosis without myelopathy or radiculopathy, cervical region: Secondary | ICD-10-CM | POA: Diagnosis not present

## 2024-03-03 DIAGNOSIS — Z79891 Long term (current) use of opiate analgesic: Secondary | ICD-10-CM | POA: Diagnosis not present

## 2024-03-03 DIAGNOSIS — M961 Postlaminectomy syndrome, not elsewhere classified: Secondary | ICD-10-CM | POA: Diagnosis not present

## 2024-03-25 DIAGNOSIS — R0981 Nasal congestion: Secondary | ICD-10-CM | POA: Diagnosis not present
